# Patient Record
Sex: Male | Born: 1935 | Race: Black or African American | Hispanic: No | Smoking: Never smoker
Health system: Southern US, Community
[De-identification: ages and names within clinical notes are randomized; demographics above are authoritative.]

## PROBLEM LIST (undated history)

## (undated) DIAGNOSIS — I10 Essential (primary) hypertension: Secondary | ICD-10-CM

## (undated) DIAGNOSIS — M1990 Unspecified osteoarthritis, unspecified site: Secondary | ICD-10-CM

## (undated) DIAGNOSIS — I152 Hypertension secondary to endocrine disorders: Secondary | ICD-10-CM

## (undated) DIAGNOSIS — E1159 Type 2 diabetes mellitus with other circulatory complications: Secondary | ICD-10-CM

## (undated) DIAGNOSIS — G629 Polyneuropathy, unspecified: Secondary | ICD-10-CM

## (undated) DIAGNOSIS — E119 Type 2 diabetes mellitus without complications: Secondary | ICD-10-CM

## (undated) DIAGNOSIS — F0150 Vascular dementia without behavioral disturbance: Secondary | ICD-10-CM

## (undated) DIAGNOSIS — F0280 Dementia in other diseases classified elsewhere without behavioral disturbance: Secondary | ICD-10-CM

## (undated) HISTORY — DX: Essential (primary) hypertension: I10

---

## 2005-09-04 ENCOUNTER — Ambulatory Visit: Payer: Self-pay | Admitting: Ophthalmology

## 2005-09-10 ENCOUNTER — Ambulatory Visit: Payer: Self-pay | Admitting: Ophthalmology

## 2006-01-15 ENCOUNTER — Ambulatory Visit: Payer: Self-pay | Admitting: Psychiatry

## 2006-01-16 ENCOUNTER — Ambulatory Visit: Payer: Self-pay | Admitting: Psychiatry

## 2006-04-05 ENCOUNTER — Ambulatory Visit: Payer: Self-pay | Admitting: Gastroenterology

## 2006-05-03 ENCOUNTER — Inpatient Hospital Stay: Payer: Self-pay | Admitting: General Surgery

## 2006-07-26 ENCOUNTER — Inpatient Hospital Stay (HOSPITAL_COMMUNITY): Admission: AD | Admit: 2006-07-26 | Discharge: 2006-07-28 | Payer: Self-pay | Admitting: Neurosurgery

## 2007-05-13 ENCOUNTER — Ambulatory Visit: Payer: Self-pay | Admitting: Gastroenterology

## 2007-07-16 ENCOUNTER — Emergency Department: Payer: Self-pay | Admitting: Emergency Medicine

## 2007-12-22 ENCOUNTER — Ambulatory Visit: Payer: Self-pay | Admitting: Internal Medicine

## 2008-06-26 ENCOUNTER — Ambulatory Visit: Payer: Self-pay | Admitting: Gastroenterology

## 2008-07-03 ENCOUNTER — Ambulatory Visit: Payer: Self-pay | Admitting: Internal Medicine

## 2009-02-21 ENCOUNTER — Ambulatory Visit: Payer: Self-pay | Admitting: Internal Medicine

## 2009-03-29 ENCOUNTER — Ambulatory Visit: Payer: Self-pay | Admitting: Urology

## 2009-05-16 ENCOUNTER — Inpatient Hospital Stay: Payer: Self-pay | Admitting: Internal Medicine

## 2009-05-16 ENCOUNTER — Ambulatory Visit: Payer: Self-pay | Admitting: Gastroenterology

## 2014-01-26 ENCOUNTER — Emergency Department: Payer: Self-pay | Admitting: Emergency Medicine

## 2014-01-26 LAB — COMPREHENSIVE METABOLIC PANEL
ALBUMIN: 3.5 g/dL (ref 3.4–5.0)
ALK PHOS: 107 U/L
Anion Gap: 6 — ABNORMAL LOW (ref 7–16)
BILIRUBIN TOTAL: 0.4 mg/dL (ref 0.2–1.0)
BUN: 21 mg/dL — ABNORMAL HIGH (ref 7–18)
CHLORIDE: 107 mmol/L (ref 98–107)
CREATININE: 1.7 mg/dL — AB (ref 0.60–1.30)
Calcium, Total: 8.8 mg/dL (ref 8.5–10.1)
Co2: 28 mmol/L (ref 21–32)
EGFR (Non-African Amer.): 39 — ABNORMAL LOW
GFR CALC AF AMER: 45 — AB
GLUCOSE: 151 mg/dL — AB (ref 65–99)
OSMOLALITY: 287 (ref 275–301)
POTASSIUM: 3.4 mmol/L — AB (ref 3.5–5.1)
SGOT(AST): 26 U/L (ref 15–37)
SGPT (ALT): 19 U/L (ref 12–78)
Sodium: 141 mmol/L (ref 136–145)
Total Protein: 7.3 g/dL (ref 6.4–8.2)

## 2014-01-26 LAB — CBC WITH DIFFERENTIAL/PLATELET
BASOS PCT: 0.2 %
Basophil #: 0 10*3/uL (ref 0.0–0.1)
EOS ABS: 0 10*3/uL (ref 0.0–0.7)
Eosinophil %: 0.2 %
HCT: 37.9 % — ABNORMAL LOW (ref 40.0–52.0)
HGB: 12.1 g/dL — ABNORMAL LOW (ref 13.0–18.0)
LYMPHS PCT: 1.4 %
Lymphocyte #: 0.1 10*3/uL — ABNORMAL LOW (ref 1.0–3.6)
MCH: 23.6 pg — AB (ref 26.0–34.0)
MCHC: 31.9 g/dL — AB (ref 32.0–36.0)
MCV: 74 fL — AB (ref 80–100)
MONO ABS: 0.1 x10 3/mm — AB (ref 0.2–1.0)
MONOS PCT: 0.7 %
NEUTROS ABS: 8.5 10*3/uL — AB (ref 1.4–6.5)
NEUTROS PCT: 97.5 %
Platelet: 162 10*3/uL (ref 150–440)
RBC: 5.12 10*6/uL (ref 4.40–5.90)
RDW: 15.7 % — AB (ref 11.5–14.5)
WBC: 8.7 10*3/uL (ref 3.8–10.6)

## 2014-01-26 LAB — URINALYSIS, COMPLETE
Bilirubin,UR: NEGATIVE
Glucose,UR: 50 mg/dL (ref 0–75)
KETONE: NEGATIVE
NITRITE: NEGATIVE
Ph: 5 (ref 4.5–8.0)
RBC,UR: 1247 /HPF (ref 0–5)
SPECIFIC GRAVITY: 1.009 (ref 1.003–1.030)
Squamous Epithelial: NONE SEEN

## 2014-01-26 LAB — LIPASE, BLOOD: Lipase: 175 U/L (ref 73–393)

## 2014-01-27 LAB — URINE CULTURE

## 2016-09-30 ENCOUNTER — Emergency Department
Admission: EM | Admit: 2016-09-30 | Discharge: 2016-09-30 | Disposition: A | Discharge status: Home / Self Care | Payer: Self-pay | Attending: Emergency Medicine | Admitting: Emergency Medicine

## 2016-09-30 DIAGNOSIS — Z5321 Procedure and treatment not carried out due to patient leaving prior to being seen by health care provider: Secondary | ICD-10-CM | POA: Insufficient documentation

## 2016-09-30 DIAGNOSIS — R300 Dysuria: Secondary | ICD-10-CM | POA: Insufficient documentation

## 2016-09-30 NOTE — ED Notes (Signed)
No answer when called several times from lobby 

## 2016-10-01 ENCOUNTER — Inpatient Hospital Stay: Payer: Medicare PPO

## 2016-10-01 ENCOUNTER — Encounter: Payer: Self-pay | Admitting: Emergency Medicine

## 2016-10-01 ENCOUNTER — Inpatient Hospital Stay
Admission: EM | Admit: 2016-10-01 | Discharge: 2016-10-03 | DRG: 683 | Disposition: A | Discharge status: Home Health | Payer: Medicare PPO | Attending: Internal Medicine | Admitting: Internal Medicine

## 2016-10-01 DIAGNOSIS — M25512 Pain in left shoulder: Secondary | ICD-10-CM | POA: Diagnosis present

## 2016-10-01 DIAGNOSIS — M6281 Muscle weakness (generalized): Secondary | ICD-10-CM

## 2016-10-01 DIAGNOSIS — N320 Bladder-neck obstruction: Secondary | ICD-10-CM | POA: Diagnosis present

## 2016-10-01 DIAGNOSIS — N179 Acute kidney failure, unspecified: Secondary | ICD-10-CM | POA: Diagnosis present

## 2016-10-01 DIAGNOSIS — N401 Enlarged prostate with lower urinary tract symptoms: Secondary | ICD-10-CM | POA: Diagnosis present

## 2016-10-01 DIAGNOSIS — R262 Difficulty in walking, not elsewhere classified: Secondary | ICD-10-CM

## 2016-10-01 DIAGNOSIS — R531 Weakness: Secondary | ICD-10-CM | POA: Diagnosis present

## 2016-10-01 DIAGNOSIS — N189 Chronic kidney disease, unspecified: Secondary | ICD-10-CM

## 2016-10-01 DIAGNOSIS — W19XXXA Unspecified fall, initial encounter: Secondary | ICD-10-CM | POA: Diagnosis present

## 2016-10-01 DIAGNOSIS — R339 Retention of urine, unspecified: Secondary | ICD-10-CM | POA: Diagnosis present

## 2016-10-01 DIAGNOSIS — N181 Chronic kidney disease, stage 1: Secondary | ICD-10-CM | POA: Diagnosis present

## 2016-10-01 DIAGNOSIS — D696 Thrombocytopenia, unspecified: Secondary | ICD-10-CM | POA: Diagnosis present

## 2016-10-01 DIAGNOSIS — Z23 Encounter for immunization: Secondary | ICD-10-CM

## 2016-10-01 DIAGNOSIS — N390 Urinary tract infection, site not specified: Secondary | ICD-10-CM | POA: Diagnosis present

## 2016-10-01 DIAGNOSIS — Z7982 Long term (current) use of aspirin: Secondary | ICD-10-CM

## 2016-10-01 DIAGNOSIS — N289 Disorder of kidney and ureter, unspecified: Secondary | ICD-10-CM

## 2016-10-01 DIAGNOSIS — D72829 Elevated white blood cell count, unspecified: Secondary | ICD-10-CM

## 2016-10-01 DIAGNOSIS — N138 Other obstructive and reflux uropathy: Secondary | ICD-10-CM | POA: Diagnosis present

## 2016-10-01 DIAGNOSIS — R338 Other retention of urine: Secondary | ICD-10-CM | POA: Diagnosis present

## 2016-10-01 DIAGNOSIS — R197 Diarrhea, unspecified: Secondary | ICD-10-CM | POA: Diagnosis present

## 2016-10-01 DIAGNOSIS — R2681 Unsteadiness on feet: Secondary | ICD-10-CM

## 2016-10-01 LAB — COMPREHENSIVE METABOLIC PANEL
ALBUMIN: 3.4 g/dL — AB (ref 3.5–5.0)
ALK PHOS: 64 U/L (ref 38–126)
ALT: 38 U/L (ref 17–63)
AST: 108 U/L — AB (ref 15–41)
Anion gap: 13 (ref 5–15)
BILIRUBIN TOTAL: 0.7 mg/dL (ref 0.3–1.2)
BUN: 34 mg/dL — AB (ref 6–20)
CO2: 23 mmol/L (ref 22–32)
CREATININE: 3.02 mg/dL — AB (ref 0.61–1.24)
Calcium: 8.7 mg/dL — ABNORMAL LOW (ref 8.9–10.3)
Chloride: 101 mmol/L (ref 101–111)
GFR calc Af Amer: 21 mL/min — ABNORMAL LOW (ref >60)
GFR, EST NON AFRICAN AMERICAN: 18 mL/min — AB (ref >60)
GLUCOSE: 120 mg/dL — AB (ref 65–99)
Potassium: 3.6 mmol/L (ref 3.5–5.1)
Sodium: 137 mmol/L (ref 135–145)
TOTAL PROTEIN: 7 g/dL (ref 6.5–8.1)

## 2016-10-01 LAB — URINALYSIS, COMPLETE (UACMP) WITH MICROSCOPIC
Bilirubin Urine: NEGATIVE
GLUCOSE, UA: 50 mg/dL — AB
Ketones, ur: NEGATIVE mg/dL
Nitrite: NEGATIVE
PROTEIN: 100 mg/dL — AB
Specific Gravity, Urine: 1.015 (ref 1.005–1.030)
pH: 5 (ref 5.0–8.0)

## 2016-10-01 LAB — CBC WITH DIFFERENTIAL/PLATELET
BASOS ABS: 0 10*3/uL (ref 0–0.1)
Basophils Relative: 0 %
Eosinophils Absolute: 0 10*3/uL (ref 0–0.7)
Eosinophils Relative: 0 %
HEMATOCRIT: 40.4 % (ref 40.0–52.0)
HEMOGLOBIN: 12.9 g/dL — AB (ref 13.0–18.0)
LYMPHS PCT: 3 %
Lymphs Abs: 0.6 10*3/uL — ABNORMAL LOW (ref 1.0–3.6)
MCH: 23.2 pg — ABNORMAL LOW (ref 26.0–34.0)
MCHC: 31.9 g/dL — ABNORMAL LOW (ref 32.0–36.0)
MCV: 72.8 fL — AB (ref 80.0–100.0)
MONO ABS: 1.7 10*3/uL — AB (ref 0.2–1.0)
Monocytes Relative: 8 %
NEUTROS ABS: 18.7 10*3/uL — AB (ref 1.4–6.5)
NEUTROS PCT: 89 %
Platelets: 148 10*3/uL — ABNORMAL LOW (ref 150–440)
RBC: 5.54 MIL/uL (ref 4.40–5.90)
RDW: 15.7 % — ABNORMAL HIGH (ref 11.5–14.5)
WBC: 21.1 10*3/uL — AB (ref 3.8–10.6)

## 2016-10-01 MED ORDER — TROLAMINE SALICYLATE 10 % EX CREA
TOPICAL_CREAM | Freq: Two times a day (BID) | CUTANEOUS | Status: DC | PRN
  Administered 2016-10-01 – 2016-10-02 (×2): via TOPICAL
  Filled 2016-10-01 (×2): qty 85

## 2016-10-01 MED ORDER — ASPIRIN EC 81 MG PO TBEC
81.0000 mg | DELAYED_RELEASE_TABLET | Freq: Every day | ORAL | Status: DC
  Administered 2016-10-02 – 2016-10-03 (×2): 81 mg via ORAL
  Filled 2016-10-01 (×2): qty 1

## 2016-10-01 MED ORDER — ONDANSETRON HCL 4 MG PO TABS: 4.0000 mg | ORAL_TABLET | Freq: Four times a day (QID) | ORAL | Status: DC | PRN

## 2016-10-01 MED ORDER — CEFTRIAXONE SODIUM-DEXTROSE 1-3.74 GM-% IV SOLR
1.0000 g | Freq: Once | INTRAVENOUS | Status: AC
  Administered 2016-10-01: 1 g via INTRAVENOUS

## 2016-10-01 MED ORDER — DEXTROSE 5 % IV SOLN
1.0000 g | INTRAVENOUS | Status: DC
  Administered 2016-10-02: 1 g via INTRAVENOUS
  Filled 2016-10-01 (×2): qty 10

## 2016-10-01 MED ORDER — ACETAMINOPHEN 325 MG PO TABS
650.0000 mg | ORAL_TABLET | Freq: Four times a day (QID) | ORAL | Status: DC | PRN
  Administered 2016-10-02: 650 mg via ORAL
  Filled 2016-10-01: qty 2

## 2016-10-01 MED ORDER — PNEUMOCOCCAL VAC POLYVALENT 25 MCG/0.5ML IJ INJ
0.5000 mL | INJECTION | INTRAMUSCULAR | Status: AC
Start: 2016-10-02 — End: 2016-10-02
  Administered 2016-10-02: 0.5 mL via INTRAMUSCULAR
  Filled 2016-10-01: qty 0.5

## 2016-10-01 MED ORDER — FINASTERIDE 5 MG PO TABS
5.0000 mg | ORAL_TABLET | Freq: Every day | ORAL | Status: DC
  Administered 2016-10-01 – 2016-10-03 (×3): 5 mg via ORAL
  Filled 2016-10-01 (×5): qty 1

## 2016-10-01 MED ORDER — DEXTROSE 5 % IV SOLN: 1.0000 g | INTRAVENOUS | Status: DC

## 2016-10-01 MED ORDER — TRAMADOL HCL 50 MG PO TABS
50.0000 mg | ORAL_TABLET | Freq: Four times a day (QID) | ORAL | Status: DC | PRN
  Administered 2016-10-02: 50 mg via ORAL
  Filled 2016-10-01: qty 1

## 2016-10-01 MED ORDER — HEPARIN SODIUM (PORCINE) 5000 UNIT/ML IJ SOLN
5000.0000 [IU] | Freq: Three times a day (TID) | INTRAMUSCULAR | Status: DC
  Administered 2016-10-01 – 2016-10-03 (×5): 5000 [IU] via SUBCUTANEOUS
  Filled 2016-10-01 (×5): qty 1

## 2016-10-01 MED ORDER — CEFTRIAXONE SODIUM-DEXTROSE 1-3.74 GM-% IV SOLR
INTRAVENOUS | Status: AC
  Administered 2016-10-01: 1 g via INTRAVENOUS
  Filled 2016-10-01: qty 50

## 2016-10-01 MED ORDER — SODIUM CHLORIDE 0.9 % IV SOLN
INTRAVENOUS | Status: DC
  Administered 2016-10-01 – 2016-10-03 (×4): via INTRAVENOUS

## 2016-10-01 MED ORDER — SODIUM CHLORIDE 0.9 % IV BOLUS (SEPSIS)
1000.0000 mL | Freq: Once | INTRAVENOUS | Status: AC
  Administered 2016-10-01: 1000 mL via INTRAVENOUS

## 2016-10-01 MED ORDER — DEXTROSE 5 % IV SOLN: 1.0000 g | Freq: Once | INTRAVENOUS | Status: DC

## 2016-10-01 MED ORDER — ACETAMINOPHEN 650 MG RE SUPP: 650.0000 mg | Freq: Four times a day (QID) | RECTAL | Status: DC | PRN

## 2016-10-01 MED ORDER — DOCUSATE SODIUM 100 MG PO CAPS
100.0000 mg | ORAL_CAPSULE | Freq: Two times a day (BID) | ORAL | Status: DC
  Administered 2016-10-01 – 2016-10-02 (×2): 100 mg via ORAL
  Filled 2016-10-01 (×2): qty 1

## 2016-10-01 MED ORDER — INFLUENZA VAC SPLIT QUAD 0.5 ML IM SUSY
0.5000 mL | PREFILLED_SYRINGE | INTRAMUSCULAR | Status: AC
  Administered 2016-10-02: 0.5 mL via INTRAMUSCULAR
  Filled 2016-10-01 (×2): qty 0.5

## 2016-10-01 MED ORDER — TAMSULOSIN HCL 0.4 MG PO CAPS
0.4000 mg | ORAL_CAPSULE | Freq: Every day | ORAL | Status: DC
  Administered 2016-10-01 – 2016-10-03 (×3): 0.4 mg via ORAL
  Filled 2016-10-01 (×5): qty 1

## 2016-10-01 MED ORDER — ONDANSETRON HCL 4 MG/2ML IJ SOLN: 4.0000 mg | Freq: Four times a day (QID) | INTRAMUSCULAR | Status: DC | PRN

## 2016-10-01 NOTE — H&P (Signed)
[facility] [place] at [facility]   PATIENT NAME: Elijah Brightlyaul Delavega    MR#:  [id number]  DATE OF BIRTH:  January 15, 1936  DATE OF ADMISSION:  10/01/2016  PRIMARY CARE PHYSICIAN: Barbette ReichmannHANDE,VISHWANATH, MD   REQUESTING/REFERRING PHYSICIAN:   CHIEF COMPLAINT:   Chief Complaint  Patient presents with  . Urinary Retention    HISTORY OF PRESENT ILLNESS: Elijah Dominguez  is a 81 y.o. male with Not significant past medical history who presents to the hospital with complaints of lower abdominal pain. He presented to emergency room yesterday with bladder outlet obstruction. However, due to significant weight time he decided to go home. He came back today and was noted to have at 1 L of urine on bladder scan, for a catheter was placed and pussy urine was expelled. Patient's labs revealed elevated white cell count, thrombocytopenia, acute on chronic renal failure with creatinine of about 3. Hospitalist services were contacted for admission. Patient denies any fevers or chills, admits to weakness, admits of diarrhea for the couple of days now. He admits of fall yesterday in the evening, he was not able to get up for about 15 minutes. He hurt his left shoulder.  PAST MEDICAL HISTORY:  History reviewed. No pertinent past medical history.  PAST SURGICAL HISTORY: History reviewed. No pertinent surgical history.  SOCIAL HISTORY:  Social History  Substance Use Topics  . Smoking status: Never Smoker  . Smokeless tobacco: Never Used  . Alcohol use No    FAMILY HISTORY: No family history on file.  DRUG ALLERGIES: No Known Allergies  Review of Systems  Constitutional: Negative for chills, fever and weight loss.  HENT: Negative for congestion.   Eyes: Negative for blurred vision and double vision.  Respiratory: Negative for cough, sputum production, shortness of breath and wheezing.   Cardiovascular: Negative for chest pain, palpitations, orthopnea, leg swelling and PND.  Gastrointestinal:  Positive for abdominal pain and diarrhea. Negative for blood in stool, constipation, nausea and vomiting.  Genitourinary: Positive for dysuria. Negative for frequency, hematuria and urgency.  Musculoskeletal: Positive for falls and joint pain.  Neurological: Negative for dizziness, tremors, focal weakness and headaches.  Endo/Heme/Allergies: Does not bruise/bleed easily.  Psychiatric/Behavioral: Negative for depression. The patient does not have insomnia.     MEDICATIONS AT HOME:  Prior to Admission medications   Medication Sig Start Date End Date Taking? Authorizing Provider  aspirin EC 81 MG tablet Take 81 mg by mouth daily.    Yes Historical Provider, MD      PHYSICAL EXAMINATION:   VITAL SIGNS: Blood pressure 140/67, pulse 63, temperature 98 F (36.7 C), temperature source Oral, resp. rate 17, height 6\' 2"  (1.88 m), weight 86.2 kg (190 lb), SpO2 100 %.  GENERAL:  81 y.o.-year-old patient lying in the bed with no acute distress.  EYES: Pupils equal, round, reactive to light and accommodation. No scleral icterus. Extraocular muscles intact.  HEENT: Head atraumatic, normocephalic. Oropharynx and nasopharynx clear.  NECK:  Supple, no jugular venous distention. No thyroid enlargement, no tenderness.  LUNGS: Normal breath sounds bilaterally, no wheezing, rales,rhonchi or crepitation. No use of accessory muscles of respiration.  CARDIOVASCULAR: S1, S2 normal. No murmurs, rubs, or gallops.  ABDOMEN: Soft, Tender in the suprapubic area but no rebound or guarding, nondistended. Bowel sounds present. No organomegaly or mass. Foley catheter reveals urine with sediment, darker color EXTREMITIES: No pedal edema, cyanosis, or clubbing. Left shoulder range of motion is decreased, palpation did not exhibit any bony abnormalities NEUROLOGIC:  Cranial nerves II through XII are intact. Muscle strength 5/5 in all extremities. Sensation intact. Gait not checked.  PSYCHIATRIC: The patient is alert and  oriented x 3.  SKIN: No obvious rash, lesion, or ulcer.   LABORATORY PANEL:   CBC  Recent Labs Lab 10/01/16 1231  WBC 21.1*  HGB 12.9*  HCT 40.4  PLT 148*  MCV 72.8*  MCH 23.2*  MCHC 31.9*  RDW 15.7*  LYMPHSABS 0.6*  MONOABS 1.7*  EOSABS 0.0  BASOSABS 0.0   ------------------------------------------------------------------------------------------------------------------  Chemistries   Recent Labs Lab 10/01/16 1231  NA 137  K 3.6  CL 101  CO2 23  GLUCOSE 120*  BUN 34*  CREATININE 3.02*  CALCIUM 8.7*  AST 108*  ALT 38  ALKPHOS 64  BILITOT 0.7   ------------------------------------------------------------------------------------------------------------------  Cardiac Enzymes No results for input(s): TROPONINI in the last 168 hours. ------------------------------------------------------------------------------------------------------------------  RADIOLOGY: No results found.  EKG: No orders found for this or any previous visit.  IMPRESSION AND PLAN:  Active Problems:   Acute on chronic renal failure (HCC)   Urinary tract infection   Acute urinary retention   Generalized weakness   Leukocytosis  #1. Urinary tract infection with bladder outlet obstruction, acute urinary retention, status post Foley catheter placement, initiate patient and continue Rocephin, awaiting for urinary cultures #2. Bladder outlet obstruction/acute urinary retention, likely urinary tract infection/BPH related, continue Foley, initiate patient on Flomax and finasteride, patient will need to have his Foley catheter removed within 1 week in physician's office #3. Acute on chronic renal failure, continue IV fluids, follow creatinine in the morning, get renal ultrasound, nephrologist consult #4. Generalized weakness, get physical therapist involved for recommendations, #5. Leukocytosis, follow with therapy #6. Left shoulder pain, Aspercreme, heating pad  All the records are reviewed  and case discussed with ED provider. Management plans discussed with the patient, family and they are in agreement.  CODE STATUS: Code Status History    This patient does not have a recorded code status. Please follow your organizational policy for patients in this situation.       TOTAL TIME TAKING CARE OF THIS PATIENT: 50 minutes.    Katharina Caper M.D on 10/01/2016 at 3:38 PM  Between 7am to 6pm - Pager - [phone] After 6pm go to [email or web address] - password [email or web address]  [place]  [facility]  Office  [phone]  CC: Primary care physician; Barbette Reichmann, MD

## 2016-10-01 NOTE — ED Triage Notes (Signed)
Pt c/o urinary retention for 2-3 days. Was here yesterday but left due to the wait.

## 2016-10-01 NOTE — ED Provider Notes (Signed)
Time Seen: Approximately 1221  I have reviewed the triage notes  Chief Complaint: Urinary Retention   History of Present Illness: Elijah Dominguez is a 81 y.o. male who is been having some issues with urinary output with some increasing lower abdominal pain for the last couple of days. Patient was here yesterday but due to volume cannot be seen yesterday and left without being seen by a physician. Patient returns today and had a bladder scan performed in triage area which showed greater than a liter of urine. Patient had a Foley catheter established. He denies any fever, nausea, vomiting etc.   History reviewed. No pertinent past medical history.  There are no active problems to display for this patient.   History reviewed. No pertinent surgical history.  History reviewed. No pertinent surgical history.  Current Outpatient Rx  . Order #: [id number] Class: Historical Med    Allergies:  Patient has no known allergies.  Family History: No family history on file.  Social History: Social History  Substance Use Topics  . Smoking status: Never Smoker  . Smokeless tobacco: Never Used  . Alcohol use No     Review of Systems:   10 point review of systems was performed and was otherwise negative:  Constitutional: No fever Eyes: No visual disturbances ENT: No sore throat, ear pain Cardiac: No chest pain Respiratory: No shortness of breath, wheezing, or stridor Abdomen: Lower middle abdominal pain without flank pain. No vomiting or diarrhea. He describes some loose stool with some bowel urgency without constipation  Endocrine: No weight loss, No night sweats Extremities: No peripheral edema, cyanosis Skin: No rashes, easy bruising Neurologic: No focal weakness, trouble with speech or swollowing Urologic: No dysuria, Hematuria, or urinary frequency   Physical Exam:  ED Triage Vitals  Enc Vitals Group     BP 10/01/16 0952 (!) 155/67     Pulse Rate 10/01/16 0952 80     Resp  10/01/16 0952 18     Temp 10/01/16 0952 98 F (36.7 C)     Temp Source 10/01/16 0952 Oral     SpO2 10/01/16 0952 100 %     Weight 10/01/16 0953 190 lb (86.2 kg)     Height 10/01/16 0953 6\' 2"  (1.88 m)     Head Circumference --      Peak Flow --      Pain Score 10/01/16 0953 10     Pain Loc --      Pain Edu? --      Excl. in GC? --     General: Awake , Alert , and Oriented times 3; GCS 15 Head: Normal cephalic , atraumatic Eyes: Pupils equal , round, reactive to light Nose/Throat: No nasal drainage, patent upper airway without erythema or exudate. Dry mucous membranes Neck: Supple, Full range of motion, No anterior adenopathy or palpable thyroid masses Lungs: Clear to ascultation without wheezes , rhonchi, or rales Heart: Regular rate, regular rhythm without murmurs , gallops , or rubs Abdomen: Soft, non tender without rebound, guarding , or rigidity; bowel sounds positive and symmetric in all 4 quadrants. No organomegaly .        Extremities: 2 plus symmetric pulses. No edema, clubbing or cyanosis Neurologic: normal ambulation, Motor symmetric without deficits, sensory intact Skin: warm, dry, no rashes   Labs:   All laboratory work was reviewed including any pertinent negatives or positives listed below:  Labs Reviewed  URINALYSIS, COMPLETE (UACMP) WITH MICROSCOPIC - Abnormal; Notable for the following:  Result Value   Color, Urine YELLOW (*)    APPearance TURBID (*)    Glucose, UA 50 (*)    Hgb urine dipstick LARGE (*)    Protein, ur 100 (*)    Leukocytes, UA SMALL (*)    Bacteria, UA MANY (*)    Squamous Epithelial / LPF 0-5 (*)    All other components within normal limits  CBC WITH DIFFERENTIAL/PLATELET - Abnormal; Notable for the following:    WBC 21.1 (*)    Hemoglobin 12.9 (*)    MCV 72.8 (*)    MCH 23.2 (*)    MCHC 31.9 (*)    RDW 15.7 (*)    Platelets 148 (*)    Neutro Abs 18.7 (*)    Lymphs Abs 0.6 (*)    Monocytes Absolute 1.7 (*)    All other  components within normal limits  COMPREHENSIVE METABOLIC PANEL - Abnormal; Notable for the following:    Glucose, Bld 120 (*)    BUN 34 (*)    Creatinine, Ser 3.02 (*)    Calcium 8.7 (*)    Albumin 3.4 (*)    AST 108 (*)    GFR calc non Af Amer 18 (*)    GFR calc Af Amer 21 (*)    All other components within normal limits  URINE CULTURE   Review of laboratory work shows an elevated white blood cell count and also some prerenal insufficiency with an increasing creatinine to 3.02 Urine culture is pending    ED Course: * The patient had a Foley catheter established in the triage area and had greater than 1 L of urine output. Patient's otherwise hemodynamically stable and doesn't appear to have any symptoms consistent with sepsis. Patient was started on a 1 L normal saline bolus and IV Rocephin with urine culture pending. Clinical Course      Assessment: Urinary tract infection versus prostatitis Urinary outlet obstruction Renal failure  Final Clinical Impression  Final diagnoses:  Acute urinary retention  Renal insufficiency     Plan:  Inpatient management            Jennye Moccasin, MD 10/01/16 1429

## 2016-10-02 LAB — CBC
HEMATOCRIT: 37.2 % — AB (ref 40.0–52.0)
HEMOGLOBIN: 12.1 g/dL — AB (ref 13.0–18.0)
MCH: 23.4 pg — AB (ref 26.0–34.0)
MCHC: 32.6 g/dL (ref 32.0–36.0)
MCV: 71.8 fL — ABNORMAL LOW (ref 80.0–100.0)
Platelets: 142 10*3/uL — ABNORMAL LOW (ref 150–440)
RBC: 5.18 MIL/uL (ref 4.40–5.90)
RDW: 15.4 % — ABNORMAL HIGH (ref 11.5–14.5)
WBC: 15.5 10*3/uL — ABNORMAL HIGH (ref 3.8–10.6)

## 2016-10-02 LAB — BASIC METABOLIC PANEL
ANION GAP: 5 (ref 5–15)
BUN: 27 mg/dL — AB (ref 6–20)
CHLORIDE: 107 mmol/L (ref 101–111)
CO2: 28 mmol/L (ref 22–32)
Calcium: 8.4 mg/dL — ABNORMAL LOW (ref 8.9–10.3)
Creatinine, Ser: 1.54 mg/dL — ABNORMAL HIGH (ref 0.61–1.24)
GFR calc Af Amer: 48 mL/min — ABNORMAL LOW (ref >60)
GFR, EST NON AFRICAN AMERICAN: 41 mL/min — AB (ref >60)
GLUCOSE: 134 mg/dL — AB (ref 65–99)
POTASSIUM: 3.5 mmol/L (ref 3.5–5.1)
Sodium: 140 mmol/L (ref 135–145)

## 2016-10-02 NOTE — Progress Notes (Signed)
[facility] - [place] at [facility]   PATIENT NAME: Elijah Dominguez    MR#:  [id number]  DATE OF BIRTH:  10/30/1935  SUBJECTIVE: Patient is seen at bedside, admitted yesterday because of urine   retention, renal failure. Also has UTI, started on antibiotics, IV fluids. Today renal function improved. Patient says that he feels little better. Foley is draining clear yellow urine.   CHIEF COMPLAINT:   Chief Complaint  Patient presents with  . Urinary Retention    REVIEW OF SYSTEMS:   ROS CONSTITUTIONAL: No fever, fatigue or weakness.  EYES: No blurred or double vision.  EARS, NOSE, AND THROAT: No tinnitus or ear pain.  RESPIRATORY: No cough, shortness of breath, wheezing or hemoptysis.  CARDIOVASCULAR: No chest pain, orthopnea, edema.  GASTROINTESTINAL: No nausea, vomiting, diarrhea or abdominal pain.  GENITOURINARY: No dysuria, hematuria.  ENDOCRINE: No polyuria, nocturia,  HEMATOLOGY: No anemia, easy bruising or bleeding SKIN: No rash or lesion. MUSCULOSKELETAL: No joint pain or arthritis.   NEUROLOGIC: No tingling, numbness, weakness.  PSYCHIATRY: No anxiety or depression.   DRUG ALLERGIES:  No Known Allergies  VITALS:  Blood pressure (!) 157/61, pulse 68, temperature 97.8 F (36.6 C), temperature source Oral, resp. rate 16, height 6\' 2"  (1.88 m), weight 86.2 kg (190 lb), SpO2 100 %.  PHYSICAL EXAMINATION:  GENERAL:  81 y.o.-year-old patient lying in the bed with no acute distress.  EYES: Pupils equal, round, reactive to light and accommodation. No scleral icterus. Extraocular muscles intact.  HEENT: Head atraumatic, normocephalic. Oropharynx and nasopharynx clear.  NECK:  Supple, no jugular venous distention. No thyroid enlargement, no tenderness.  LUNGS: Normal breath sounds bilaterally, no wheezing, rales,rhonchi or crepitation. No use of accessory muscles of respiration.  CARDIOVASCULAR: S1, S2 normal. No murmurs, rubs, or gallops.  ABDOMEN: Soft,  nontender, nondistended. Bowel sounds present. No organomegaly or mass.  EXTREMITIES: No pedal edema, cyanosis, or clubbing.  NEUROLOGIC: Cranial nerves II through XII are intact. Muscle strength 5/5 in all extremities. Sensation intact. Gait not checked.  PSYCHIATRIC: The patient is alert and oriented x 3.  SKIN: No obvious rash, lesion, or ulcer.    LABORATORY PANEL:   CBC  Recent Labs Lab 10/02/16 0518  WBC 15.5*  HGB 12.1*  HCT 37.2*  PLT 142*   ------------------------------------------------------------------------------------------------------------------  Chemistries   Recent Labs Lab 10/01/16 1231 10/02/16 0518  NA 137 140  K 3.6 3.5  CL 101 107  CO2 23 28  GLUCOSE 120* 134*  BUN 34* 27*  CREATININE 3.02* 1.54*  CALCIUM 8.7* 8.4*  AST 108*  --   ALT 38  --   ALKPHOS 64  --   BILITOT 0.7  --    ------------------------------------------------------------------------------------------------------------------  Cardiac Enzymes No results for input(s): TROPONINI in the last 168 hours. ------------------------------------------------------------------------------------------------------------------  RADIOLOGY:  US Renal  Result Date: 10/01/2016 CLINICAL DATA:  Acute renal failure. EXAM: RENAL / URINARY TRACT ULTRASOUND COMPLETE COMPARISON:  01/26/2014. FINDINGS: Right Kidney: Length: 10.4 cm. Echogenicity within normal limits. No mass or hydronephrosis visualized. Left Kidney: Length: 10.5 cm. Echogenicity within normal limits. No hydronephrosis visualized. 4.9 cm simple cyst midportion left kidney. Bladder: Foley is present in the bladder. Bladder is nondistended. Bladder wall is thickened. This could be the from lack of distention however bladder pathology including cystitis cannot be excluded. IMPRESSION: 1. Renal echotexture normal. No hydronephrosis. Simple cysts left kidney. 2. Foley catheter noted bladder. The bladder is nondistended. Bladder wall is  thickened. This could be from nondistended  state. Active bladder pathology including cystitis cannot be excluded. Electronically Signed   By: Maisie Fushomas  Register   On: 10/01/2016 16:41    EKG:  No orders found for this or any previous visit.  ASSESSMENT AND PLAN:   Abdominal pain secondary to urine retention: Patient had 1 L of urine in bladder in the emergency room, after Foley is inserted patient felt better. Started on first BPH medications, Foley. And his abdominal pain improved. #2 acute renal failure: Postobstructive: Improved with the Foley, Flomax, IV fluids also. #3 U tI: Continue Rocephin, follow urine cultures, CBC improving. From 21-15.5. Diarrhea: Improved. Code full code.    All the records are reviewed and case discussed with Care Management/Social Workerr. Management plans discussed with the patient, family and they are in agreement.  CODE STATUS: full  TOTAL TIME TAKING CARE OF THIS PATIENT: 35 minutes.   POSSIBLE D/C IN 1-2  DAYS, DEPENDING ON CLINICAL CONDITION.   Katha HammingKONIDENA,Makeisha Jentsch M.D on 10/02/2016 at 2:02 PM  Between 7am to 6pm - Pager - [phone]  After 6pm go to [email or web address] - password [email or web address]  [facility]  Office  [phone]  CC: Primary care physician; Barbette ReichmannHANDE,VISHWANATH, MD   Note: This dictation was prepared with Dragon dictation along with smaller phrase technology. Any transcriptional errors that result from this process are unintentional.

## 2016-10-02 NOTE — Consult Note (Signed)
[facility] CONSULT NOTE    Date: 10/02/2016                  Patient Name:  Elijah Dominguez  MRN: [id number]  DOB: 04-10-1936  Age / Sex: 81 y.o., male         PCP: Barbette Reichmann, MD                 Service Requesting Consult: Hospitalist                 Reason for Consult: Acute renal failure            History of Present Illness: Patient is a 81 y.o. male with No significant past medical history who was admitted to [facility] on 10/01/2016 for evaluation of Urinary retention.  He recently presented to the emergency department with difficulty urinating.  However secondary to prolonged wait time he left emergency department.  When he came back he was noted as having 1 L of urine on bladder scan. Subsequently a Foley catheter was placed and he was found to have purulent urine.  He has been started on finasteride and Flomax.  Renal ultrasound was actually negative for hydronephrosis.  In addition he has been started on ceftriaxone.   Medications: Outpatient medications: Prescriptions Prior to Admission  Medication Sig Dispense Refill Last Dose  . aspirin EC 81 MG tablet Take 81 mg by mouth daily.    Past Week at 0900    Current medications: Current Facility-Administered Medications  Medication Dose Route Frequency Provider Last Rate Last Dose  . 0.9 %  sodium chloride infusion   Intravenous Continuous Katha Hamming, MD 75 mL/hr at 10/02/16 1417    . acetaminophen (TYLENOL) tablet 650 mg  650 mg Oral Q6H PRN Katharina Caper, MD       Or  . acetaminophen (TYLENOL) suppository 650 mg  650 mg Rectal Q6H PRN Katharina Caper, MD      . aspirin EC tablet 81 mg  81 mg Oral Daily Katharina Caper, MD   81 mg at 10/02/16 1030  . cefTRIAXone (ROCEPHIN) 1 g in dextrose 5 % 50 mL IVPB  1 g Intravenous Q24H Katharina Caper, MD   1 g at 10/02/16 1413  . finasteride (PROSCAR) tablet 5 mg  5 mg Oral Daily Katharina Caper, MD   5 mg at 10/02/16 1030  . heparin injection 5,000 Units   5,000 Units Subcutaneous Q8H Katharina Caper, MD   5,000 Units at 10/02/16 1416  . ondansetron (ZOFRAN) tablet 4 mg  4 mg Oral Q6H PRN Katharina Caper, MD       Or  . ondansetron (ZOFRAN) injection 4 mg  4 mg Intravenous Q6H PRN Katharina Caper, MD      . tamsulosin (FLOMAX) capsule 0.4 mg  0.4 mg Oral Daily Katharina Caper, MD   0.4 mg at 10/02/16 1030  . traMADol (ULTRAM) tablet 50 mg  50 mg Oral Q6H PRN Katharina Caper, MD   50 mg at 10/02/16 1236  . trolamine salicylate (ASPERCREME) 10 % cream   Topical BID PRN Katharina Caper, MD          Allergies: No Known Allergies    Past Medical History: History reviewed. No pertinent past medical history.   Past Surgical History: History reviewed. No pertinent surgical history.   Family History: No family history of ESRD  Social History: Social History   Social History  . Marital status: Widowed    Spouse name:  N/A  . Number of children: N/A  . Years of education: N/A   Occupational History  . Not on file.   Social History Main Topics  . Smoking status: Never Smoker  . Smokeless tobacco: Never Used  . Alcohol use No  . Drug use: Unknown  . Sexual activity: Not on file   Other Topics Concern  . Not on file   Social History Narrative  . No narrative on file     Review of Systems: Review of Systems  Constitutional: Positive for malaise/fatigue. Negative for chills, fever and weight loss.  HENT: Negative for hearing loss and tinnitus.   Eyes: Negative for blurred vision, double vision and photophobia.  Respiratory: Negative for cough, hemoptysis, sputum production and shortness of breath.   Cardiovascular: Negative for chest pain and palpitations.  Gastrointestinal: Positive for diarrhea. Negative for heartburn, nausea and vomiting.  Genitourinary: Positive for dysuria and frequency.  Musculoskeletal: Negative for back pain and myalgias.  Skin: Negative for itching and rash.  Neurological: Negative for dizziness and focal  weakness.  Endo/Heme/Allergies: Negative for polydipsia. Does not bruise/bleed easily.  Psychiatric/Behavioral: Negative for depression and memory loss. The patient is not nervous/anxious.      Vital Signs: Blood pressure (!) 157/61, pulse 68, temperature 97.8 F (36.6 C), temperature source Oral, resp. rate 16, height 6\' 2"  (1.88 m), weight 86.2 kg (190 lb), SpO2 100 %.  Weight trends: Filed Weights   10/01/16 0953  Weight: 86.2 kg (190 lb)    Physical Exam: General: NAD  Head: Normocephalic, atraumatic.  Eyes: Anicteric, EOMI  Nose: Mucous membranes moist, not inflammed, nonerythematous.  Throat: Oropharynx nonerythematous, no exudate appreciated.   Neck: Supple, trachea midline.  Lungs:  Normal respiratory effort. Clear to auscultation BL without crackles or wheezes.  Heart: RRR. S1 and S2 normal without gallop, murmur, or rubs.  Abdomen:  BS normoactive. Soft, Nondistended, non-tender.  No masses or organomegaly.  Extremities: No pretibial edema.  Neurologic: A&O X3, Motor strength is 5/5 in the all 4 extremities  Skin: No visible rashes, scars.    Lab results: Basic Metabolic Panel:  Recent Labs Lab 10/01/16 1231 10/02/16 0518  NA 137 140  K 3.6 3.5  CL 101 107  CO2 23 28  GLUCOSE 120* 134*  BUN 34* 27*  CREATININE 3.02* 1.54*  CALCIUM 8.7* 8.4*    Liver Function Tests:  Recent Labs Lab 10/01/16 1231  AST 108*  ALT 38  ALKPHOS 64  BILITOT 0.7  PROT 7.0  ALBUMIN 3.4*   No results for input(s): LIPASE, AMYLASE in the last 168 hours. No results for input(s): AMMONIA in the last 168 hours.  CBC:  Recent Labs Lab 10/01/16 1231 10/02/16 0518  WBC 21.1* 15.5*  NEUTROABS 18.7*  --   HGB 12.9* 12.1*  HCT 40.4 37.2*  MCV 72.8* 71.8*  PLT 148* 142*    Cardiac Enzymes: No results for input(s): CKTOTAL, CKMB, CKMBINDEX, TROPONINI in the last 168 hours.  BNP: Invalid input(s): POCBNP  CBG: No results for input(s): GLUCAP in the last 168  hours.  Microbiology: Results for orders placed or performed in visit on 01/26/14  Urine culture     Status: None   Collection Time: 01/26/14  1:37 AM  Result Value Ref Range Status   Micro Text Report   Final       SOURCE: CLEAN CATCH    ORGANISM 1                >  100,000 CFU/ML STREPTOCOCCUS AGALACTIAE(GROUP B)   COMMENT                   -   ANTIBIOTIC                                                        Coagulation Studies: No results for input(s): LABPROT, INR in the last 72 hours.  Urinalysis:  Recent Labs  10/01/16 1231  COLORURINE YELLOW*  LABSPEC 1.015  PHURINE 5.0  GLUCOSEU 50*  HGBUR LARGE*  BILIRUBINUR NEGATIVE  KETONESUR NEGATIVE  PROTEINUR 100*  NITRITE NEGATIVE  LEUKOCYTESUR SMALL*      Imaging: [place] Renal  Result Date: 10/01/2016 CLINICAL DATA:  Acute renal failure. EXAM: RENAL / URINARY TRACT ULTRASOUND COMPLETE COMPARISON:  01/26/2014. FINDINGS: Right Kidney: Length: 10.4 cm. Echogenicity within normal limits. No mass or hydronephrosis visualized. Left Kidney: Length: 10.5 cm. Echogenicity within normal limits. No hydronephrosis visualized. 4.9 cm simple cyst midportion left kidney. Bladder: Foley is present in the bladder. Bladder is nondistended. Bladder wall is thickened. This could be the from lack of distention however bladder pathology including cystitis cannot be excluded. IMPRESSION: 1. Renal echotexture normal. No hydronephrosis. Simple cysts left kidney. 2. Foley catheter noted bladder. The bladder is nondistended. Bladder wall is thickened. This could be from nondistended state. Active bladder pathology including cystitis cannot be excluded. Electronically Signed   By: Maisie Fushomas  Register   On: 10/01/2016 16:41      Assessment & Plan: Pt is a 81 y.o. male with No significant past medical history who [facility] for evaluation of Urinary retention.  1.  Acute renal failure likely secondary to acute urinary  retention. 2.  Urinary retention. 3.  Urinary tract infection due to #2.  Plan:  The patient presented with acute urinary retention which has now led to acute renal failure as well as urinary tract infection.  Agree with Foley catheter placement.  Continue IV fluid hydration with 0.9 normal saline at 75 cc per hour.  We also agree with ceftriaxone 1 g IV every 24 hours.  Patient should be continued on finasteride as well as Flomax as well.  Continue to follow renal function trend daily and avoid nephrotoxins as possible.  Followup BMP tomorrow as well.  Thanks for consultation.

## 2016-10-02 NOTE — Care Management Important Message (Signed)
Important Message  Patient Details  Name: Elijah Dominguez MRN: [id number] Date of Birth: 12-15-1935   Medicare Important Message Given:  Yes    Chapman FitchBOWEN, Shamiya Demeritt T, RN 10/02/2016, 4:41 PM

## 2016-10-02 NOTE — Clinical Social Work Note (Signed)
Consult placed by nursing for potential rehab needs, consult placed by nursing to RN CM for potential home health needs. PT will need to assess patient and make recommendations. Please re-consult CSW if situation warrants. York SpanielMonica Marveline Profeta MSW,LCSW [phone]

## 2016-10-02 NOTE — Care Management (Signed)
Patient admitted from home with AKI, urinary retention, UTI.  Patient lives at home with daughter.  Will likely discharge with foley.  PCP HANDE.  PT consult pending.  RNCM following

## 2016-10-03 LAB — BASIC METABOLIC PANEL
Anion gap: 8 (ref 5–15)
BUN: 18 mg/dL (ref 6–20)
CO2: 25 mmol/L (ref 22–32)
CREATININE: 1.12 mg/dL (ref 0.61–1.24)
Calcium: 8.5 mg/dL — ABNORMAL LOW (ref 8.9–10.3)
Chloride: 107 mmol/L (ref 101–111)
GFR calc Af Amer: >60 mL/min (ref >60)
GFR calc non Af Amer: >60 mL/min (ref >60)
Glucose, Bld: 119 mg/dL — ABNORMAL HIGH (ref 65–99)
POTASSIUM: 3.4 mmol/L — AB (ref 3.5–5.1)
Sodium: 140 mmol/L (ref 135–145)

## 2016-10-03 LAB — CBC
HEMATOCRIT: 33.9 % — AB (ref 40.0–52.0)
HEMOGLOBIN: 11 g/dL — AB (ref 13.0–18.0)
MCH: 23.6 pg — ABNORMAL LOW (ref 26.0–34.0)
MCHC: 32.5 g/dL (ref 32.0–36.0)
MCV: 72.4 fL — AB (ref 80.0–100.0)
Platelets: 143 10*3/uL — ABNORMAL LOW (ref 150–440)
RBC: 4.68 MIL/uL (ref 4.40–5.90)
RDW: 15.3 % — ABNORMAL HIGH (ref 11.5–14.5)
WBC: 9.7 10*3/uL (ref 3.8–10.6)

## 2016-10-03 LAB — URINE CULTURE: Culture: NO GROWTH

## 2016-10-03 MED ORDER — AMLODIPINE BESYLATE 5 MG PO TABS: 5.0000 mg | ORAL_TABLET | Freq: Every day | ORAL | 0 refills | Status: DC

## 2016-10-03 MED ORDER — TROLAMINE SALICYLATE 10 % EX CREA: TOPICAL_CREAM | Freq: Two times a day (BID) | CUTANEOUS | 0 refills | Status: DC | PRN

## 2016-10-03 MED ORDER — TAMSULOSIN HCL 0.4 MG PO CAPS: 0.4000 mg | ORAL_CAPSULE | Freq: Every day | ORAL | 0 refills | Status: DC

## 2016-10-03 MED ORDER — CEPHALEXIN 500 MG PO CAPS: 500.0000 mg | ORAL_CAPSULE | Freq: Two times a day (BID) | ORAL | 0 refills | Status: DC

## 2016-10-03 MED ORDER — FINASTERIDE 5 MG PO TABS: 5.0000 mg | ORAL_TABLET | Freq: Every day | ORAL | 0 refills | Status: DC

## 2016-10-03 NOTE — Care Management Note (Signed)
Case Management Note  Patient Details  Name: Elijah Dominguez MRN: [id number] Date of Birth: Sep 09, 1936  Subjective/Objective:       Discussed discharge planning with daughter [name] who chose [facility] to be Elijah Dominguez's home health services provider. A referral was faxed to [facility] requesting home health PT. Elijah Dominguez already has a RW at home per his daughters report.              Action/Plan:   Expected Discharge Date:  10/03/16               Expected Discharge Plan:     In-House Referral:     Discharge planning Services     Post Acute Care Choice:    Choice offered to:     DME Arranged:    DME Agency:     HH Arranged:    HH Agency:     Status of Service:     If discussed at [employer] Meetings, dates discussed:    Additional Comments:  Nylani Michetti A, RN 10/03/2016, 12:30 PM

## 2016-10-03 NOTE — Discharge Instructions (Signed)
Follow all MD discharge instructions. Take all medications as prescribed. Keep all follow up appointments. If your symptoms return, call your doctor. If you experience any new symptoms that are of concern to you or that are bothersome to you, call your doctor. For all questions and/or concerns, call your doctor.   If you have a medical emergency, call 911  You are being discharged with a urinary catheter (Foley catheter) in place. Do not attempt to remove it. Make sure to keep you penis and the area around your urethra clean. Do not touch the catheter tubing near your urethra. Empty your catheter daily, or when it becomes half-full, and write down how much urine you emptied, and bring that record to your follow up doctor appointment. Notify your doctor if urine stops coming out of you catheter, if you have bright red blood come out of your catheter, if you have bloating or abdominal fullness and/or pressure, or if you have pain in your pelvic area, stomach, or penis. For all other questions, call your doctor.     Acute Urinary Retention, Male Acute urinary retention is when you are unable to pee (urinate). Acute urinary retention is common in older men. Prostates can get bigger, which blocks the flow of pee. Follow these instructions at home:  Drink enough fluids to keep your pee clear or pale yellow.  If you are sent home with a tube that drains the bladder (catheter), there will be a drainage bag attached to it. There are two types of bags. One is big that you can wear at night without having to empty it. One is smaller and needs to be emptied more often.  Keep the drainage bag empty.  Keep the drainage bag lower than your catheter.  Only take medicine as told by your doctor. Contact a doctor if:  You have a low-grade fever.  You have spasms or you are leaking pee when you have spasms. Get help right away if:  You have chills or a fever.  Your catheter stops draining pee.  Your  catheter falls out.  You have increased bleeding that does not stop after you have rested and increased the amount of fluids you had been drinking. This information is not intended to replace advice given to you by your health care provider. Make sure you discuss any questions you have with your health care provider. Document Released: 02/24/2008 Document Revised: 02/13/2016 Document Reviewed: 02/16/2013 Elsevier Interactive Patient Education  2017 [employer]

## 2016-10-03 NOTE — Evaluation (Signed)
Physical Therapy Evaluation Patient Details Name: Elijah Dominguez MRN: [id number] DOB: Jan 02, 1936 Today's Date: 10/03/2016   History of Present Illness  Pt admitted for acute on chronic renal failure with UTI and bladder outlet obstruction. Pt complains of urinary retention and lower abdominal pain. Pt with recent fall and reports L shoulder pain. No pain reported this date.  Clinical Impression  Pt is a pleasant 81 year old male who was admitted for acute on chronic renal failure with UTI and bladder outlet obstruction. Pt performs bed mobility with mod I, transfers with supervision, and ambulation with cga and RW.  Further ambulation performed with SPC (pt's baseline) with slight unsteadiness noted. Recommend continued use of RW at this time with HHPT to [facility] Pt demonstrates deficits with balance/strength/mobility. Pt confused with situation upon arrival, however agreeable to therapy. Would benefit from skilled PT to address above deficits and promote optimal return to PLOF. Recommend transition to HHPT upon discharge from acute hospitalization.      Follow Up Recommendations Home health PT (with daughter to provide 24/7 assist until confusion clears)    Equipment Recommendations  Rolling walker with 5" wheels    Recommendations for Other Services       Precautions / Restrictions Precautions Precautions: Fall Restrictions Weight Bearing Restrictions: No      Mobility  Bed Mobility Overal bed mobility: Modified Independent             General bed mobility comments: safe technique without using rails for assist. Once seated at EOB, able to sit with upright posture/balance  Transfers Overall transfer level: Needs assistance Equipment used: Rolling walker (2 wheeled) Transfers: Sit to/from Stand Sit to Stand: Supervision         General transfer comment: safe technique with upright posture. Needs cues to push from seated surface instead of pulling  up on RW  Ambulation/Gait Ambulation/Gait assistance: Min guard Ambulation Distance (Feet): 60 Feet Assistive device: Rolling walker (2 wheeled) Gait Pattern/deviations: Step-through pattern     General Gait Details: slight unsteadiness using RW. Needs cues for sequencing and obstacle avoidance. Reciprocal gait pattern performed. Further ambulation in ther-ex [facility]  Stairs            Wheelchair Mobility    Modified Rankin (Stroke Patients Only)       Balance Overall balance assessment: History of Falls;Needs assistance Sitting-balance support: Feet supported Sitting balance-Leahy Scale: Good     Standing balance support: Bilateral upper extremity supported Standing balance-Leahy Scale: Good                               Pertinent Vitals/Pain Pain Assessment: No/denies pain    Home Living Family/patient expects to be discharged to:: Private residence Living Arrangements: Children (daughter) Available Help at Discharge: Family;Available PRN/intermittently Type of Home: House Home Access: Stairs to enter Entrance Stairs-Rails: None Entrance Stairs-Number of Steps: 3 Home Layout: Able to live on main level with bedroom/bathroom;Two level Home Equipment: Cane - single point      Prior Function Level of Independence: Independent with assistive device(s)         Comments: uses SPC     Hand Dominance        Extremity/Trunk Assessment   Upper Extremity Assessment Upper Extremity Assessment: Overall WFL for tasks assessed    Lower Extremity Assessment Lower Extremity Assessment: Generalized weakness (B LE grossly 4/5)       Communication  Communication: No difficulties  Cognition Arousal/Alertness: Awake/alert Behavior During Therapy: WFL for tasks assessed/performed Overall Cognitive Status: Impaired/Different from baseline                      General Comments      Exercises Other Exercises Other Exercises: Pt  ambulated using SPC with slight unsteadiness with min assist for prevention of falls. Pt drags SPC too far behind, needs cues for correct technique. 60' ambulated with slow gait speed   Assessment/Plan    PT Assessment Patient needs continued PT services  PT Problem List Decreased strength;Decreased activity tolerance;Decreased balance;Decreased mobility;Decreased safety awareness;Decreased cognition          PT Treatment Interventions Gait training;DME instruction;Therapeutic exercise    PT Goals (Current goals can be found in the Care Plan section)  Acute Rehab PT Goals Patient Stated Goal: to go home PT Goal Formulation: With patient Time For Goal Achievement: 10/17/16 Potential to Achieve Goals: Good    Frequency Min 2X/week   Barriers to discharge        Co-evaluation               End of Session Equipment Utilized During Treatment: Gait belt Activity Tolerance: Patient tolerated treatment well Patient left: in bed;with bed alarm set Nurse Communication: Mobility status         Time: 3086-5784 PT Time Calculation (min) (ACUTE ONLY): 20 min   Charges:   PT Evaluation $PT Eval Moderate Complexity: 1 Procedure PT Treatments $Gait Training: 8-22 mins   PT G Codes:        Satsuki Zillmer October 22, 2016, 3:38 PM  Elizabeth Palau, PT, DPT [phone]

## 2016-10-03 NOTE — Progress Notes (Signed)
[facility]  ROUNDING NOTE   Subjective:  Urine output was acceptable yesterday 1.3 L. Renal function has improved significantly and creatinine is down to 1.1. Foley catheter remains in place.   Objective:  Vital signs in last 24 hours:  Temp:  [97.8 F (36.6 C)-98.2 F (36.8 C)] 97.8 F (36.6 C) (01/13 0832) Pulse Rate:  [56-69] 69 (01/13 0832) Resp:  [16-18] 18 (01/13 0832) BP: (157-177)/(61-68) 177/68 (01/13 0832) SpO2:  [98 %-100 %] 100 % (01/13 0832)  Weight change:  Filed Weights   10/01/16 0953  Weight: 86.2 kg (190 lb)    Intake/Output: I/O last 3 completed shifts: In: 2319 [I.V.:2269; IV Piggyback:50] Out: 2825 [Urine:2825]   Intake/Output this shift:  Total I/O In: 411 [P.O.:240; I.V.:171] Out: 575 [Urine:575]  Physical Exam: General: No acute distress  Head: Normocephalic, atraumatic. Moist oral mucosal membranes  Eyes: Anicteric  Neck: Supple, trachea midline  Lungs:  Clear to auscultation, normal effort  Heart: S1S2 no rubs  Abdomen:  Soft, nontender,   Extremities:  peripheral edema.  Neurologic: Nonfocal, moving all four extremities  Skin: No lesions  GU: Foley in place    Basic Metabolic Panel:  Recent Labs Lab 10/01/16 1231 10/02/16 0518 10/03/16 0636  NA 137 140 140  K 3.6 3.5 3.4*  CL 101 107 107  CO2 23 28 25   GLUCOSE 120* 134* 119*  BUN 34* 27* 18  CREATININE 3.02* 1.54* 1.12  CALCIUM 8.7* 8.4* 8.5*    Liver Function Tests:  Recent Labs Lab 10/01/16 1231  AST 108*  ALT 38  ALKPHOS 64  BILITOT 0.7  PROT 7.0  ALBUMIN 3.4*   No results for input(s): LIPASE, AMYLASE in the last 168 hours. No results for input(s): AMMONIA in the last 168 hours.  CBC:  Recent Labs Lab 10/01/16 1231 10/02/16 0518 10/03/16 0636  WBC 21.1* 15.5* 9.7  NEUTROABS 18.7*  --   --   HGB 12.9* 12.1* 11.0*  HCT 40.4 37.2* 33.9*  MCV 72.8* 71.8* 72.4*  PLT 148* 142* 143*    Cardiac Enzymes: No results for input(s):  CKTOTAL, CKMB, CKMBINDEX, TROPONINI in the last 168 hours.  BNP: Invalid input(s): POCBNP  CBG: No results for input(s): GLUCAP in the last 168 hours.  Microbiology: Results for orders placed or performed during the hospital encounter of 10/01/16  Urine culture     Status: None   Collection Time: 10/01/16 12:31 PM  Result Value Ref Range Status   Specimen Description URINE, CATHETERIZED  Final   Special Requests NONE  Final   Culture NO GROWTH Performed at [facility]   Final   Report Status 10/03/2016 FINAL  Final    Coagulation Studies: No results for input(s): LABPROT, INR in the last 72 hours.  Urinalysis:  Recent Labs  10/01/16 1231  COLORURINE YELLOW*  LABSPEC 1.015  PHURINE 5.0  GLUCOSEU 50*  HGBUR LARGE*  BILIRUBINUR NEGATIVE  KETONESUR NEGATIVE  PROTEINUR 100*  NITRITE NEGATIVE  LEUKOCYTESUR SMALL*      Imaging: US Renal  Result Date: 10/01/2016 CLINICAL DATA:  Acute renal failure. EXAM: RENAL / URINARY TRACT ULTRASOUND COMPLETE COMPARISON:  01/26/2014. FINDINGS: Right Kidney: Length: 10.4 cm. Echogenicity within normal limits. No mass or hydronephrosis visualized. Left Kidney: Length: 10.5 cm. Echogenicity within normal limits. No hydronephrosis visualized. 4.9 cm simple cyst midportion left kidney. Bladder: Foley is present in the bladder. Bladder is nondistended. Bladder wall is thickened. This could be the from lack of distention however bladder  pathology including cystitis cannot be excluded. IMPRESSION: 1. Renal echotexture normal. No hydronephrosis. Simple cysts left kidney. 2. Foley catheter noted bladder. The bladder is nondistended. Bladder wall is thickened. This could be from nondistended state. Active bladder pathology including cystitis cannot be excluded. Electronically Signed   By: Maisie Fushomas  Register   On: 10/01/2016 16:41     Medications:   . sodium chloride 75 mL/hr at 10/03/16 0707   . aspirin EC  81 mg Oral Daily  . cefTRIAXone  (ROCEPHIN) IVPB 1 gram/50 mL D5W  1 g Intravenous Q24H  . finasteride  5 mg Oral Daily  . heparin  5,000 Units Subcutaneous Q8H  . tamsulosin  0.4 mg Oral Daily   acetaminophen **OR** acetaminophen, ondansetron **OR** ondansetron (ZOFRAN) IV, traMADol, trolamine salicylate  Assessment/ Plan:  81 y.o. male with No significant past medical history who [facility] for evaluation of Urinary retention.  1.  Acute renal failure likely secondary to acute urinary retention. 2.  Urinary retention. 3.  Urinary tract infection due to #2.  Plan:  Renal function has improved significantly with relief of urinary retention. Creatinine down to 1.1. Urine culture thus far negative however patient had significant hematuria and pyuria. Okay to continue ceftriaxone for 1 additional day. Agree with continuation of Flomax and finasteride. Okay to discontinue normal saline at this time. Otherwise plan per hospitalist.   LOS: 2 Jana Swartzlander 1/13/201811:04 AM

## 2016-10-03 NOTE — Progress Notes (Signed)
Pt d/c home; d/c instructions reviewed w/ pt; pt understanding was verbalized; IV removed, catheter in tact, gauze dressing applied; all pt questions answered; pt verbalized that all pt belongings were accounted for; pt d/c w/ foley in place; pt teaching re foley care done, pt verbalized understanding. Printed materials on home foley care and when to notify MD also given to pt at d/c; pt left unit via wheelchair accompanied by staff

## 2016-10-03 NOTE — Discharge Summary (Addendum)
Elijah Dominguez, is a 81 y.o. male  DOB December 07, 1935  MRN [id number].  Admission date:  10/01/2016  Admitting Physician  Katharina Caper, MD  Discharge Date:  10/03/2016   Primary MD  Barbette Reichmann, MD  Recommendations for primary care physician for things to follow:  With primary doctor next week regarding urinary retention, Foley,   Admission Diagnosis  ARF (acute renal failure) (HCC) [N17.9] Renal insufficiency [N28.9] Acute urinary retention [R33.8]   Discharge Diagnosis  ARF (acute renal failure) (HCC) [N17.9] Renal insufficiency [N28.9] Acute urinary retention [R33.8]   Active Problems:   Acute on chronic renal failure (HCC)   Urinary tract infection   Acute urinary retention   Generalized weakness   Leukocytosis   Diarrhea   Left shoulder pain      History reviewed. No pertinent past medical history.  History reviewed. No pertinent surgical history.     History of present illness and  Hospital Course:     Kindly see H&P for history of present illness and admission details, please review complete Labs, Consult reports and Test reports for all details in brief  HPI  from the history and physical done on the day of admission 81 year old male patient admitted because of renal failure found to have urine retention, UTI.   Hospital Course  81 year old male patient with urinary retention , patient noted to have 1 L of urine on bladder scan, folic catheter placed, admitted to hospitalist service, noted to have urine infection so started on IV Rocephin. She also received IV fluids because of acute renal failure with creatinine of 3 on admission. Patient to renal function improved after starting the patient on Foley catheter, Flomax, finasteride. Patient's creatinine 1.12 today. And Foley draining clear yellow  urine. WBC normalized, patient WBC 21 on admission and today it is 9.7.. No fever. Urine cultures didn't show any growth. Today the plan is to discharge home with Foley catheter, Flomax, finasteride, Keflex. Patient needs voiding trials as an outpatient continue Flomax, finasteride, discussed this with the patient and he understood all the instructions. Patient is to see his primary doctor next week and also urologist from [facility] urology for voiding trials, further treatment for his urinary retention. Patient seen by nephrology while in the hospital.    Discharge Condition: stable   Follow UP  Follow-up Information    HANDE,VISHWANATH, MD Follow up in 3 day(s).   Specialty:  Internal Medicine Contact information: [address] [place] [address] [phone]             Discharge Instructions  and  Discharge Medications     Discharge Instructions    Urinary leg bag    Complete by:  As directed      Allergies as of 10/03/2016   No Known Allergies     Medication List    TAKE these medications   amLODipine 5 MG tablet Commonly known as:  NORVASC Take 1 tablet (5 mg total) by mouth daily.   aspirin EC 81 MG tablet Take 81 mg by mouth daily.   cephALEXin 500 MG capsule Commonly known as:  KEFLEX Take 1 capsule (500 mg total) by mouth 2 (two) times daily.   finasteride 5 MG tablet Commonly known as:  PROSCAR Take 1 tablet (5 mg total) by mouth daily. Start taking on:  10/04/2016   tamsulosin 0.4 MG Caps capsule Commonly known as:  FLOMAX Take 1 capsule (0.4 mg total) by mouth daily. Start taking on:  10/04/2016   trolamine salicylate 10 % cream Commonly known as:  ASPERCREME Apply topically 2 (two) times daily as needed for muscle pain (Left shoulder).         Diet and Activity recommendation: See Discharge Instructions above   Consults obtained -  nephrology   Major procedures and Radiology Reports - PLEASE review  detailed and final reports for all details, in brief -     [place] Renal  Result Date: 10/01/2016 CLINICAL DATA:  Acute renal failure. EXAM: RENAL / URINARY TRACT ULTRASOUND COMPLETE COMPARISON:  01/26/2014. FINDINGS: Right Kidney: Length: 10.4 cm. Echogenicity within normal limits. No mass or hydronephrosis visualized. Left Kidney: Length: 10.5 cm. Echogenicity within normal limits. No hydronephrosis visualized. 4.9 cm simple cyst midportion left kidney. Bladder: Foley is present in the bladder. Bladder is nondistended. Bladder wall is thickened. This could be the from lack of distention however bladder pathology including cystitis cannot be excluded. IMPRESSION: 1. Renal echotexture normal. No hydronephrosis. Simple cysts left kidney. 2. Foley catheter noted bladder. The bladder is nondistended. Bladder wall is thickened. This could be from nondistended state. Active bladder pathology including cystitis cannot be excluded. Electronically Signed   By: Maisie Fushomas  Register   On: 10/01/2016 16:41    Micro Results     Recent Results (from the past 240 hour(s))  Urine culture     Status: None   Collection Time: 10/01/16 12:31 PM  Result Value Ref Range Status   Specimen Description URINE, CATHETERIZED  Final   Special Requests NONE  Final   Culture NO GROWTH [facility]   Final   Report Status 10/03/2016 FINAL  Final       Today   Subjective:   Elijah Dominguez today has no headache,no chest abdominal pain,no new weakness tingling or numbness, feels much better wants to go home today.   Objective:   Blood pressure (!) 177/68, pulse 69, temperature 97.8 F (36.6 C), temperature source Axillary, resp. rate 18, height 6\' 2"  (1.88 m), weight 86.2 kg (190 lb), SpO2 100 %.   Intake/Output Summary (Last 24 hours) at 10/03/16 1120 Last data filed at 10/03/16 0951  Gross per 24 hour  Intake             1624 ml  Output             1425 ml  Net              199 ml     Exam Awake Alert, Oriented x 3, No new F.N deficits, Normal affect [place].AT,PERRAL Supple Neck,No JVD, No cervical lymphadenopathy appriciated.  Symmetrical Chest wall movement, Good air movement bilaterally, CTAB RRR,No Gallops,Rubs or new Murmurs, No Parasternal Heave +ve B.Sounds, Abd Soft, Non tender, No organomegaly appriciated, No rebound -guarding or rigidity. No Cyanosis, Clubbing or edema, No new Rash or bruise  Data Review   CBC w Diff:  Lab Results  Component Value Date   WBC 9.7 10/03/2016   HGB 11.0 (L) 10/03/2016   HGB 12.1 (L) 01/26/2014   HCT 33.9 (L) 10/03/2016   HCT 37.9 (L) 01/26/2014   PLT 143 (L) 10/03/2016   PLT 162 01/26/2014   LYMPHOPCT 3 10/01/2016   LYMPHOPCT 1.4 01/26/2014   MONOPCT 8 10/01/2016   MONOPCT 0.7 01/26/2014   EOSPCT 0 10/01/2016   EOSPCT 0.2 01/26/2014   BASOPCT 0 10/01/2016   BASOPCT 0.2 01/26/2014    CMP:  Lab Results  Component Value Date   NA 140  10/03/2016   NA 141 01/26/2014   K 3.4 (L) 10/03/2016   K 3.4 (L) 01/26/2014   CL 107 10/03/2016   CL 107 01/26/2014   CO2 25 10/03/2016   CO2 28 01/26/2014   BUN 18 10/03/2016   BUN 21 (H) 01/26/2014   CREATININE 1.12 10/03/2016   CREATININE 1.70 (H) 01/26/2014   PROT 7.0 10/01/2016   PROT 7.3 01/26/2014   ALBUMIN 3.4 (L) 10/01/2016   ALBUMIN 3.5 01/26/2014   BILITOT 0.7 10/01/2016   BILITOT 0.4 01/26/2014   ALKPHOS 64 10/01/2016   ALKPHOS 107 01/26/2014   AST 108 (H) 10/01/2016   AST 26 01/26/2014   ALT 38 10/01/2016   ALT 19 01/26/2014  .   Total Time in preparing paper work, data evaluation and todays exam - 35 minutes  Jaclene Bartelt M.D on 10/03/2016 at 11:20 AM    Note: This dictation was prepared with Dragon dictation along with smaller phrase technology. Any transcriptional errors that result from this process are unintentional.

## 2016-10-07 ENCOUNTER — Ambulatory Visit: Payer: Self-pay | Admitting: Urology

## 2016-10-09 ENCOUNTER — Ambulatory Visit: Payer: Self-pay | Admitting: Urology

## 2016-10-12 ENCOUNTER — Encounter: Payer: Self-pay | Admitting: Urology

## 2016-10-12 ENCOUNTER — Ambulatory Visit (INDEPENDENT_AMBULATORY_CARE_PROVIDER_SITE_OTHER): Payer: Medicare PPO | Admitting: Urology

## 2016-10-12 VITALS — BP 177/72 | HR 60 | Ht 75.0 in | Wt 171.0 lb

## 2016-10-12 DIAGNOSIS — R339 Retention of urine, unspecified: Secondary | ICD-10-CM | POA: Diagnosis not present

## 2016-10-12 NOTE — Progress Notes (Signed)
10/12/2016 10:07 AM   Elijah Dominguez 09/28/1935 [id number]  Referring provider: Barbette Reichmann, MD [address] [facility] [place], [place] [address]  Chief Complaint  Patient presents with  . Urinary Retention    HPI: 81 year old male presents today for follow-up from the emergency department where he was seen for urinary retention. At the time the patient was also having significant diarrhea. He was noted to have an on known amount his bladder. His creatinine was 3.0 (baseline 1.2). The patient had a urine culture performed at the time which was normal. His ultrasound was also demonstrated no evidence of hydronephrosis. The patient was started on finasteride and Flomax which he has been on now for 10 days.  The patient was given a chance to void prior to me entering the room and he was unsuccessful.  Baseline, the patient voids 6 times per night. He strains to void. He has incomplete bladder emptying. He was not taking any medications for this in the past. He has not seen or followed up with a primary care provider in some time.  The patient has no significant family history of prostate cancer.     PMH: Past Medical History:  Diagnosis Date  . Hypertension     Surgical History: History reviewed. No pertinent surgical history.  Home Medications:  Allergies as of 10/12/2016   No Known Allergies     Medication List       Accurate as of 10/12/16 10:07 AM. Always use your most recent med list.          amLODipine 5 MG tablet Commonly known as:  NORVASC Take 1 tablet (5 mg total) by mouth daily.   aspirin EC 81 MG tablet Take 81 mg by mouth daily.   cephALEXin 500 MG capsule Commonly known as:  KEFLEX Take 1 capsule (500 mg total) by mouth 2 (two) times daily.   finasteride 5 MG tablet Commonly known as:  PROSCAR Take 1 tablet (5 mg total) by mouth daily.   tamsulosin 0.4 MG Caps capsule Commonly known as:  FLOMAX Take 1 capsule (0.4 mg  total) by mouth daily.   trolamine salicylate 10 % cream Commonly known as:  ASPERCREME Apply topically 2 (two) times daily as needed for muscle pain (Left shoulder).       Allergies: No Known Allergies  Family History: Family History  Problem Relation Age of Onset  . Prostate cancer Neg Hx   . Bladder Cancer Neg Hx   . Kidney cancer Neg Hx     Social History:  reports that he has never smoked. He has never used smokeless tobacco. He reports that he does not drink alcohol or use drugs.  ROS: UROLOGY Frequent Urination?: No Hard to postpone urination?: No Burning/pain with urination?: No Get up at night to urinate?: Yes Leakage of urine?: No Urine stream starts and stops?: No Trouble starting stream?: No Do you have to strain to urinate?: No Blood in urine?: No Urinary tract infection?: Yes Sexually transmitted disease?: No Injury to kidneys or bladder?: No Painful intercourse?: No Weak stream?: No Erection problems?: No Penile pain?: No  Gastrointestinal Nausea?: No Vomiting?: No Indigestion/heartburn?: No Diarrhea?: No Constipation?: Yes  Constitutional Fever: No Night sweats?: No Weight loss?: No Fatigue?: No  Skin Skin rash/lesions?: No Itching?: No  Eyes Blurred vision?: No Double vision?: No  Ears/Nose/Throat Sore throat?: No Sinus problems?: No  Hematologic/Lymphatic Swollen glands?: No Easy bruising?: No  Cardiovascular Leg swelling?: No Chest pain?:  No  Respiratory Cough?: No Shortness of breath?: No  Endocrine Excessive thirst?: No  Musculoskeletal Back pain?: No Joint pain?: Yes  Neurological Headaches?: No Dizziness?: No  Psychologic Depression?: No Anxiety?: No  Physical Exam: BP (!) 177/72   Pulse 60   Ht 6\' 3"  (1.905 m)   Wt 77.6 kg (171 lb)   BMI 21.37 kg/m   Constitutional:  Alert and oriented, No acute distress. HEENT: [place] AT, moist mucus membranes.  Trachea midline, no masses. Cardiovascular: No  clubbing, cyanosis, or edema. Respiratory: Normal respiratory effort, no increased work of breathing. GI: Abdomen is soft, nontender, nondistended, no abdominal masses GU: No CVA tenderness. The patient has bilateral inguinal hernias which I was unable to reduce. However he is asymptomatic and states these as been present for a long time. DRE: The patient's prostate is smooth, symmetric, no nodules, plus 2 in size Skin: No rashes, bruises or suspicious lesions. Lymph: No cervical or inguinal adenopathy. Neurologic: Grossly intact, no focal deficits, moving all 4 extremities. Psychiatric: Normal mood and affect.  Laboratory Data: Lab Results  Component Value Date   WBC 9.7 10/03/2016   HGB 11.0 (L) 10/03/2016   HCT 33.9 (L) 10/03/2016   MCV 72.4 (L) 10/03/2016   PLT 143 (L) 10/03/2016    Lab Results  Component Value Date   CREATININE 1.12 10/03/2016    No results found for: PSA  No results found for: TESTOSTERONE  No results found for: HGBA1C  Urinalysis    Component Value Date/Time   COLORURINE YELLOW (A) 10/01/2016 1231   APPEARANCEUR TURBID (A) 10/01/2016 1231   APPEARANCEUR Cloudy 01/26/2014 0137   LABSPEC 1.015 10/01/2016 1231   LABSPEC 1.009 01/26/2014 0137   PHURINE 5.0 10/01/2016 1231   GLUCOSEU 50 (A) 10/01/2016 1231   GLUCOSEU 50 mg/dL 09/81/191405/04/2014 [id number]   HGBUR LARGE (A) 10/01/2016 1231   BILIRUBINUR NEGATIVE 10/01/2016 1231   BILIRUBINUR Negative 01/26/2014 0137   KETONESUR NEGATIVE 10/01/2016 1231   PROTEINUR 100 (A) 10/01/2016 1231   NITRITE NEGATIVE 10/01/2016 1231   LEUKOCYTESUR SMALL (A) 10/01/2016 1231   LEUKOCYTESUR 3+ 01/26/2014 0137    Pertinent Imaging: I reviewed the patient's ultrasound report of his kidneys demonstrating a normal echotexture and no hydronephrosis.  Assessment & Plan:  81 year old male with obstructive voiding symptoms who developed urinary retention and associated acute renal failure. His prostate felt normal to me, there is  no PSA on record. Etiology of his retention is almost certainly obstructive prostate.  We discussed the treatment options, at this time the patient would like to return in a few hours after given a longer time to void. If it he fails, we will plan to replace the catheter and give him another voiding trial next week. If he fails at that time, the patient will need to be set up for cystoscopy so that we can plan for a bladder outlet obstruction procedure. We did discuss surgery briefly, the patient was interested in considering the saphenous fails a voiding trial. He was not interested in clean intermittent catheterization although he may be more interested if he fails next week.  There are no diagnoses linked to this encounter.  No Follow-up on file.  Crist FatHERRICK, Jerzie Bieri W, MD  [facility] [address], [address] [place], [place] [address] [phone]

## 2016-10-12 NOTE — Progress Notes (Addendum)
Fill and Pull Catheter Removal  Patient is present today for a catheter removal.  Patient was cleaned and prepped in a sterile fashion 300 ml of sterile water/ saline was instilled into the bladder when the patient felt the urge to urinate. 8 ml of water was then drained from the balloon.  A 16 FR foley cath was removed from the bladder no complications were noted .  Patient as then given some time to void on their own.  Patient cannot void  0 ml on their own after some time. The pt was sent home with the instruction to drink plenty of fluids and return at 1:30pm for bladder scan.   The pt returned very uncomfortable w/ 1000cc in her bladder.  Due to urinary retention patient is present today for a foley cath placement.  Patient was cleaned and prepped in a sterile fashion with betadine and lidocaine jelly 2% was instilled into the urethra.  A 16 coude foley catheter was inserted, urine return was noted  1000ml, urine was yellow in color.  The balloon was filled with 10cc of sterile water.  A night bag was attached for drainage.   Patient was given instruction on proper catheter care.  Patient tolerated well, no complications were noted   Preformed by: Burt KnackKRussell,CMA  Additional notes/ Follow up: 1 week, Nurse Visit

## 2016-10-19 ENCOUNTER — Ambulatory Visit: Payer: Medicare PPO

## 2016-10-20 ENCOUNTER — Ambulatory Visit (INDEPENDENT_AMBULATORY_CARE_PROVIDER_SITE_OTHER): Payer: Medicare PPO

## 2016-10-20 VITALS — BP 174/75 | HR 57 | Ht 75.0 in | Wt 170.0 lb

## 2016-10-20 DIAGNOSIS — R339 Retention of urine, unspecified: Secondary | ICD-10-CM

## 2016-10-20 NOTE — Progress Notes (Signed)
Fill and Pull Catheter Removal  Patient is present today for a catheter removal.  Patient was cleaned and prepped in a sterile fashion 500ml of sterile water/ saline was instilled into the bladder when the patient felt the urge to urinate. 10ml of water was then drained from the balloon.  A 16FR coude foley cath was removed from the bladder no complications were noted.  Patient as then given some time to void on their own.  Patient cannot void . Patient tolerated well.  Follow up/ Additional notes: Due to pt not being able to urinate foley was replaced.  Simple Catheter Placement  Due to urinary retention patient is present today for a foley cath placement.  Patient was cleaned and prepped in a sterile fashion with betadine and lidocaine jelly 2% was instilled into the urethra.  A 16 FR coude foley catheter was inserted, urine return was noted  450ml, urine was clear yellow in color.  The balloon was filled with 10cc of sterile water.  A night bag was attached for drainage.  Patient was given instruction on proper catheter care.  Patient tolerated well, no complications were noted   Preformed by: Rupert Stackshelsea Rhianne Soman, LPN   Pt will f/u in 24mo with Dr. Marlou PorchHerrick for cysto and discuss possible surgery.  Blood pressure (!) 174/75, pulse (!) 57, height 6\' 3"  (1.905 m), weight 170 lb (77.1 kg).

## 2016-10-23 ENCOUNTER — Ambulatory Visit: Payer: Medicare PPO

## 2016-10-23 DIAGNOSIS — R339 Retention of urine, unspecified: Secondary | ICD-10-CM

## 2016-10-23 NOTE — Progress Notes (Signed)
Pt presented today with c/o catheter coming out of penis. Nurse inspected catheter to find it in tact and draining properly. Reinforced with pt how catheter is inserted. Pt voiced understanding.

## 2016-10-28 ENCOUNTER — Other Ambulatory Visit: Payer: Medicare PPO | Admitting: Urology

## 2016-10-29 ENCOUNTER — Encounter: Payer: Self-pay | Admitting: Urology

## 2016-10-29 ENCOUNTER — Ambulatory Visit (INDEPENDENT_AMBULATORY_CARE_PROVIDER_SITE_OTHER): Payer: Medicare PPO | Admitting: Urology

## 2016-10-29 VITALS — BP 162/63 | HR 50 | Ht 75.0 in | Wt 166.0 lb

## 2016-10-29 DIAGNOSIS — R338 Other retention of urine: Secondary | ICD-10-CM | POA: Diagnosis not present

## 2016-10-29 DIAGNOSIS — N401 Enlarged prostate with lower urinary tract symptoms: Secondary | ICD-10-CM

## 2016-10-29 DIAGNOSIS — N138 Other obstructive and reflux uropathy: Secondary | ICD-10-CM

## 2016-10-29 MED ORDER — LIDOCAINE HCL 2 % EX GEL
1.0000 "application " | Freq: Once | CUTANEOUS | Status: AC
  Administered 2016-10-29: 1 via URETHRAL

## 2016-10-29 MED ORDER — CIPROFLOXACIN HCL 500 MG PO TABS
500.0000 mg | ORAL_TABLET | Freq: Once | ORAL | Status: AC
  Administered 2016-10-29: 500 mg via ORAL

## 2016-10-29 NOTE — Progress Notes (Signed)
10/29/16  CC:  Chief Complaint  Patient presents with  . Cysto    HPI: 81 yo M with urinary retention s/p multiple failed VT who presents today cystoscopy further evaluation of his outlet.    Retention episodes associated with acute renal failure with Cr up to 3.0.    Enlarge gland without nodules on DRE in 09/2016 by Dr. Marlou Porch.  Most recent PSA 5.86 on 10/19/16 with Foley in place after recent episode of retention.    CT  from 5/15 shows thickened bladder wall and enlarged prostate gland with median lobe.  Calculated volume at that time ~ 50 cc.    Past Medical History:  Diagnosis Date  . Hypertension    No past surgical history on file.  Current Meds  Medication Sig  . amLODipine (NORVASC) 5 MG tablet Take 1 tablet (5 mg total) by mouth daily.  Marland Kitchen aspirin EC 81 MG tablet Take 81 mg by mouth daily.   . finasteride (PROSCAR) 5 MG tablet Take 1 tablet (5 mg total) by mouth daily.  . tamsulosin (FLOMAX) 0.4 MG CAPS capsule Take 1 capsule (0.4 mg total) by mouth daily.  Marland Kitchen trolamine salicylate (ASPERCREME) 10 % cream Apply topically 2 (two) times daily as needed for muscle pain (Left shoulder).  Marland Kitchen VITAMIN K PO Take by mouth.   No Known Allergies   Blood pressure (!) 162/63, pulse (!) 50, height 6\' 3"  (1.905 m), weight 166 lb (75.3 kg). NED. A&Ox3.   No respiratory distress   Abd soft, NT, ND Normal phallus with bilateral descended testicles  Cystoscopy Procedure Note  Patient identification was confirmed, informed consent was obtained, and patient was prepped using Betadine solution.  Lidocaine jelly was administered per urethral meatus.    Preoperative abx where received prior to procedure.     Pre-Procedure: - Inspection reveals a normal caliber ureteral meatus.  Procedure: The flexible cystoscope was introduced without difficulty - No urethral strictures/lesions are present. - Enlarged prostate bilobar coaptation, 5 cm prostatic length - Mildly elevated  bladder neck - Bilateral ureteral orifices identified - Bladder mucosa  reveals no ulcers, tumors, or lesions - No bladder stones - Minimal trabeculation  Retroflexion shows slight intravesical protrusion without significant median lobe.   Post-Procedure: - Patient tolerated the procedure well  Assessment/ Plan:.  1. Acute urinary retention S/p multiple failed voiding trials Enlarged prostate appreciated today on cystoscopy with bilobar coaptation BPH appreciated on previous CT scan Differential diagnosis discussed with patient including bladder outlet obstruction secondary to prostatomegaly versus hypo-contractile bladder versus combination of the latter Lengthy discussion today with he and his wife regarding treatment options moving forward. Urodynamics was reviewed in detail vs. possibility of outlet procedure Specifically, options of TURP versus laser ablation of the prostate were reviewed in detail for which he appears to be a good candidate. Risks including bleeding, infection, damage to surrounding structures, incontinence, worsening of his irritative voiding symptoms, need for Foley catheter, retrograde ejaculation, stricture and bladder neck contracture were reviewed in detail. In addition, management options for his chronic urinary retention were discussed including permanent indwelling Foley catheter, suprapubic tube,  Vs. clean intermittent catheterization. At this time, he would like to continue Foley catheter will discuss this further with his daughter. He will let us know how he like to proceed. - ciprofloxacin (CIPRO) tablet 500 mg; Take 1 tablet (500 mg total) by mouth once. - lidocaine (XYLOCAINE) 2 % jelly 1 application; Place 1 application into the urethra once.  2. BPH  with obstruction/lower urinary tract symptoms As above Continue flomax/ finasteride  Return in about 4 weeks (around 11/26/2016) for Foley change.   Vanna ScotlandAshley Lavanna Rog, MD

## 2016-11-09 ENCOUNTER — Other Ambulatory Visit: Payer: Medicare PPO

## 2016-11-10 ENCOUNTER — Other Ambulatory Visit: Payer: Self-pay | Admitting: Radiology

## 2016-11-10 ENCOUNTER — Telehealth: Payer: Self-pay | Admitting: Radiology

## 2016-11-10 DIAGNOSIS — R339 Retention of urine, unspecified: Secondary | ICD-10-CM

## 2016-11-10 DIAGNOSIS — N138 Other obstructive and reflux uropathy: Secondary | ICD-10-CM

## 2016-11-10 DIAGNOSIS — N401 Enlarged prostate with lower urinary tract symptoms: Principal | ICD-10-CM

## 2016-11-10 NOTE — Telephone Encounter (Signed)
Notified pt's daughter, Saundra Shellingracey Exline, of surgery scheduled with Dr Apolinar JunesBrandon on 11/17/16, pre-admit testing appt on 11/12/16 @10 :45 & to call day prior to surgery for arrival time to SDS. [name] voices understanding.

## 2016-11-12 ENCOUNTER — Encounter
Admission: RE | Admit: 2016-11-12 | Discharge: 2016-11-12 | Disposition: A | Discharge status: Home / Self Care | Payer: Medicare PPO | Source: Ambulatory Visit | Attending: Urology | Admitting: Urology

## 2016-11-12 DIAGNOSIS — I10 Essential (primary) hypertension: Secondary | ICD-10-CM | POA: Insufficient documentation

## 2016-11-12 DIAGNOSIS — R531 Weakness: Secondary | ICD-10-CM | POA: Insufficient documentation

## 2016-11-12 DIAGNOSIS — N189 Chronic kidney disease, unspecified: Secondary | ICD-10-CM | POA: Insufficient documentation

## 2016-11-12 DIAGNOSIS — Z01810 Encounter for preprocedural cardiovascular examination: Secondary | ICD-10-CM | POA: Insufficient documentation

## 2016-11-12 DIAGNOSIS — R001 Bradycardia, unspecified: Secondary | ICD-10-CM | POA: Insufficient documentation

## 2016-11-12 DIAGNOSIS — M25512 Pain in left shoulder: Secondary | ICD-10-CM | POA: Insufficient documentation

## 2016-11-12 DIAGNOSIS — Z01812 Encounter for preprocedural laboratory examination: Secondary | ICD-10-CM | POA: Insufficient documentation

## 2016-11-12 DIAGNOSIS — R197 Diarrhea, unspecified: Secondary | ICD-10-CM | POA: Diagnosis not present

## 2016-11-12 DIAGNOSIS — D72829 Elevated white blood cell count, unspecified: Secondary | ICD-10-CM | POA: Diagnosis not present

## 2016-11-12 DIAGNOSIS — I447 Left bundle-branch block, unspecified: Secondary | ICD-10-CM | POA: Diagnosis not present

## 2016-11-12 DIAGNOSIS — R338 Other retention of urine: Secondary | ICD-10-CM | POA: Diagnosis not present

## 2016-11-12 HISTORY — DX: Unspecified osteoarthritis, unspecified site: M19.90

## 2016-11-12 LAB — URINALYSIS, ROUTINE W REFLEX MICROSCOPIC
BILIRUBIN URINE: NEGATIVE
Glucose, UA: NEGATIVE mg/dL
Ketones, ur: NEGATIVE mg/dL
NITRITE: NEGATIVE
PROTEIN: NEGATIVE mg/dL
SQUAMOUS EPITHELIAL / LPF: NONE SEEN
Specific Gravity, Urine: 1.015 (ref 1.005–1.030)
pH: 6 (ref 5.0–8.0)

## 2016-11-12 LAB — CBC
HEMATOCRIT: 33.8 % — AB (ref 40.0–52.0)
HEMOGLOBIN: 10.9 g/dL — AB (ref 13.0–18.0)
MCH: 23.5 pg — ABNORMAL LOW (ref 26.0–34.0)
MCHC: 32.4 g/dL (ref 32.0–36.0)
MCV: 72.5 fL — AB (ref 80.0–100.0)
Platelets: 187 10*3/uL (ref 150–440)
RBC: 4.66 MIL/uL (ref 4.40–5.90)
RDW: 16.5 % — ABNORMAL HIGH (ref 11.5–14.5)
WBC: 5.2 10*3/uL (ref 3.8–10.6)

## 2016-11-12 NOTE — Pre-Procedure Instructions (Signed)
CARDIAC CLEARANCE/ EKG,AS INSTRUCTED BY DR A KARENZ,CALLED AND FAXED TO PCP DR Marcello FennelHANDE SINCE NO KNOWN CARDIOLOGIST. SPOKE WITH ASHLEY ALSO CALLED AND FAXED TO [name] AT DR Delana MeyerBRANDON'S

## 2016-11-12 NOTE — Patient Instructions (Addendum)
  Your procedure is scheduled on: November 17, 2016 (Tuesday) Report to Same Day Surgery 2nd floor medical mall [facility](Medical Mall Entrance-take elevator on left to 2nd floor.  Check in with surgery information desk.) To find out your arrival time please call [phone] between 1PM - 3PM on November 16, 2016 (Monday)  Remember: Instructions that are not followed completely may result in serious medical risk, up to and including death, or upon the discretion of your surgeon and anesthesiologist your surgery may need to be rescheduled.    _x___ 1. Do not eat food or drink liquids after midnight. No gum chewing or hard candies.     __x__ 2. No Alcohol for 24 hours before or after surgery.   __x__3. No Smoking for 24 prior to surgery.   ____  4. Bring all medications with you on the day of surgery if instructed.    __x__ 5. Notify your doctor if there is any change in your medical condition     (cold, fever, infections).     Do not wear jewelry, make-up, hairpins, clips or nail polish.  Do not wear lotions, powders, or perfumes. You may wear deodorant.  Do not shave 48 hours prior to surgery. Men may shave face and neck.  Do not bring valuables to the hospital.    [facility] is not responsible for any belongings or valuables.               Contacts, dentures or bridgework may not be worn into surgery.  Leave your suitcase in the car. After surgery it may be brought to your room.  For patients admitted to the hospital, discharge time is determined by your treatment team.   Patients discharged the day of surgery will not be allowed to drive home.  You will need someone to drive you home and stay with you the night of your procedure.    Please read over the following fact sheets that you were given:   [facility] Preparing for Surgery and or MRSA Information   _x___ Take these medicines the morning of surgery with A SIP OF WATER:    1. AMLODIPINE  2.  3.  4.  5.  6.  ____Fleets  enema or Magnesium Citrate as directed.   __ Use CHG Soap or sage wipes as directed on instruction sheet   ____ Use inhalers on the day of surgery and bring to hospital day of surgery  ____ Stop metformin 2 days prior to surgery    ____ Take 1/2 of usual insulin dose the night before surgery and none on the morning of           surgery.   _x___ Stop Aspirin, Coumadin, Pllavix ,Eliquis, Effient, or Pradaxa (Patient has stopped Aspirin)  x__ Stop Anti-inflammatories such as Advil, Aleve, Ibuprofen, Motrin, Naproxen,          Naprosyn, Goodies powders or aspirin products.(STOP ASPERCREME NOW)       Ok to take Tylenol.   __x__ Stop supplements until after surgery.  (STOP VITAMIN B-6 NOW)  ____ Bring C-Pap to the hospital.

## 2016-11-13 NOTE — Pre-Procedure Instructions (Signed)
UA and CBC sent to Dr. Apolinar JunesBrandon and Anesthesia for review.

## 2016-11-14 LAB — URINE CULTURE: Culture: >=100000 — AB

## 2016-11-16 ENCOUNTER — Telehealth: Payer: Self-pay

## 2016-11-16 DIAGNOSIS — N390 Urinary tract infection, site not specified: Secondary | ICD-10-CM

## 2016-11-16 MED ORDER — AMPICILLIN 500 MG PO CAPS: 500.0000 mg | ORAL_CAPSULE | Freq: Four times a day (QID) | ORAL | 0 refills | Status: DC

## 2016-11-16 NOTE — Telephone Encounter (Signed)
Notified pt's daughter of +ucx & script for Ampicillin 500 mg po qid x 7 days sent to pharmacy. Advised daughter that because of this, surgery will need to be rescheduled to 11/23/16 & to call Friday prior to surgery for arrival time to SDS. Daughter voices understanding.

## 2016-11-16 NOTE — Telephone Encounter (Signed)
Pt's daughter aware.

## 2016-11-16 NOTE — Telephone Encounter (Signed)
Medication sent to pharmacy. Amy will make pt aware when she is able to reschedule surgery.

## 2016-11-16 NOTE — Telephone Encounter (Signed)
-----   Message from Vanna ScotlandAshley Brandon, MD sent at 11/16/2016  7:47 AM EST ----- Please treat with Ampicillin 500 mg po qid x 7 days.  Vanna ScotlandAshley Brandon, MD

## 2016-11-17 NOTE — Telephone Encounter (Signed)
Notified pt's daughter, Saundra Shellingracey Kardell, that per Dr Marcello FennelHande, pt has been referred to cardiology. Advised of appt made with Dr Juliann Paresallwood on 11/18/16 @11 :00. [name] voices understanding.

## 2016-11-27 ENCOUNTER — Ambulatory Visit: Payer: Medicare PPO

## 2016-11-27 ENCOUNTER — Encounter: Payer: Self-pay | Admitting: Urology

## 2016-11-27 NOTE — Pre-Procedure Instructions (Signed)
Cardiac clearance received from Dr Jeanene Erballwod , and placed on chart (moderate risk).

## 2016-11-29 MED ORDER — CEFAZOLIN SODIUM-DEXTROSE 2-4 GM/100ML-% IV SOLN
2.0000 g | INTRAVENOUS | Status: AC
  Administered 2016-11-30: 2 g via INTRAVENOUS

## 2016-11-30 ENCOUNTER — Ambulatory Visit
Admission: RE | Admit: 2016-11-30 | Discharge: 2016-11-30 | Disposition: A | Discharge status: Home / Self Care | Payer: Medicare PPO | Source: Ambulatory Visit | Attending: Urology | Admitting: Urology

## 2016-11-30 ENCOUNTER — Ambulatory Visit: Payer: Medicare PPO | Admitting: Certified Registered"

## 2016-11-30 ENCOUNTER — Encounter: Payer: Self-pay | Admitting: *Deleted

## 2016-11-30 ENCOUNTER — Encounter
Admission: RE | Disposition: A | Discharge status: Home / Self Care | Payer: Self-pay | Source: Ambulatory Visit | Attending: Urology

## 2016-11-30 ENCOUNTER — Telehealth: Payer: Self-pay

## 2016-11-30 DIAGNOSIS — R338 Other retention of urine: Secondary | ICD-10-CM

## 2016-11-30 DIAGNOSIS — Z79899 Other long term (current) drug therapy: Secondary | ICD-10-CM | POA: Insufficient documentation

## 2016-11-30 DIAGNOSIS — N138 Other obstructive and reflux uropathy: Secondary | ICD-10-CM

## 2016-11-30 DIAGNOSIS — I10 Essential (primary) hypertension: Secondary | ICD-10-CM | POA: Diagnosis not present

## 2016-11-30 DIAGNOSIS — N401 Enlarged prostate with lower urinary tract symptoms: Secondary | ICD-10-CM | POA: Insufficient documentation

## 2016-11-30 DIAGNOSIS — R339 Retention of urine, unspecified: Secondary | ICD-10-CM

## 2016-11-30 DIAGNOSIS — Z7982 Long term (current) use of aspirin: Secondary | ICD-10-CM | POA: Diagnosis not present

## 2016-11-30 HISTORY — PX: THULIUM LASER TURP (TRANSURETHRAL RESECTION OF PROSTATE): SHX6744

## 2016-11-30 SURGERY — THULIUM LASER TURP (TRANSURETHRAL RESECTION OF PROSTATE)
Anesthesia: General | Site: Prostate | Wound class: Clean Contaminated

## 2016-11-30 MED ORDER — ROCURONIUM BROMIDE 100 MG/10ML IV SOLN
INTRAVENOUS | Status: DC | PRN
  Administered 2016-11-30: 40 mg via INTRAVENOUS
  Administered 2016-11-30 (×2): 5 mg via INTRAVENOUS

## 2016-11-30 MED ORDER — LACTATED RINGERS IV SOLN
INTRAVENOUS | Status: DC
  Administered 2016-11-30: 07:00:00 via INTRAVENOUS

## 2016-11-30 MED ORDER — SUGAMMADEX SODIUM 200 MG/2ML IV SOLN
INTRAVENOUS | Status: DC | PRN
  Administered 2016-11-30: 160 mg via INTRAVENOUS

## 2016-11-30 MED ORDER — PROPOFOL 10 MG/ML IV BOLUS
INTRAVENOUS | Status: DC | PRN
  Administered 2016-11-30: 120 mg via INTRAVENOUS

## 2016-11-30 MED ORDER — ONDANSETRON HCL 4 MG/2ML IJ SOLN: 4.0000 mg | Freq: Once | INTRAMUSCULAR | Status: DC | PRN

## 2016-11-30 MED ORDER — PROPOFOL 10 MG/ML IV BOLUS
INTRAVENOUS | Status: AC
  Filled 2016-11-30: qty 20

## 2016-11-30 MED ORDER — LEVOFLOXACIN 500 MG PO TABS: 500.0000 mg | ORAL_TABLET | Freq: Every day | ORAL | 0 refills | Status: DC

## 2016-11-30 MED ORDER — LEVOFLOXACIN IN D5W 500 MG/100ML IV SOLN
INTRAVENOUS | Status: DC | PRN
  Administered 2016-11-30: 500 mg via INTRAVENOUS

## 2016-11-30 MED ORDER — FAMOTIDINE 20 MG PO TABS
20.0000 mg | ORAL_TABLET | Freq: Once | ORAL | Status: AC
  Administered 2016-11-30: 20 mg via ORAL

## 2016-11-30 MED ORDER — LIDOCAINE HCL (CARDIAC) 20 MG/ML IV SOLN
INTRAVENOUS | Status: DC | PRN
  Administered 2016-11-30: 80 mg via INTRAVENOUS

## 2016-11-30 MED ORDER — FAMOTIDINE 20 MG PO TABS
ORAL_TABLET | ORAL | Status: AC
  Administered 2016-11-30: 20 mg via ORAL
  Filled 2016-11-30: qty 1

## 2016-11-30 MED ORDER — DOCUSATE SODIUM 100 MG PO CAPS: 100.0000 mg | ORAL_CAPSULE | Freq: Two times a day (BID) | ORAL | 0 refills | Status: DC

## 2016-11-30 MED ORDER — FENTANYL CITRATE (PF) 100 MCG/2ML IJ SOLN
INTRAMUSCULAR | Status: AC
  Filled 2016-11-30: qty 2

## 2016-11-30 MED ORDER — ONDANSETRON HCL 4 MG/2ML IJ SOLN
INTRAMUSCULAR | Status: DC | PRN
  Administered 2016-11-30: 4 mg via INTRAVENOUS

## 2016-11-30 MED ORDER — CEFAZOLIN SODIUM-DEXTROSE 2-4 GM/100ML-% IV SOLN
INTRAVENOUS | Status: AC
  Administered 2016-11-30: 2 g via INTRAVENOUS
  Filled 2016-11-30: qty 100

## 2016-11-30 MED ORDER — FENTANYL CITRATE (PF) 100 MCG/2ML IJ SOLN: 25.0000 ug | INTRAMUSCULAR | Status: DC | PRN

## 2016-11-30 MED ORDER — LEVOFLOXACIN IN D5W 500 MG/100ML IV SOLN
INTRAVENOUS | Status: AC
  Filled 2016-11-30: qty 100

## 2016-11-30 MED ORDER — FENTANYL CITRATE (PF) 100 MCG/2ML IJ SOLN
INTRAMUSCULAR | Status: DC | PRN
  Administered 2016-11-30: 50 ug via INTRAVENOUS
  Administered 2016-11-30: 100 ug via INTRAVENOUS

## 2016-11-30 MED ORDER — ONDANSETRON HCL 4 MG/2ML IJ SOLN
INTRAMUSCULAR | Status: AC
  Filled 2016-11-30: qty 2

## 2016-11-30 MED ORDER — PHENYLEPHRINE HCL 10 MG/ML IJ SOLN
INTRAMUSCULAR | Status: AC
  Filled 2016-11-30: qty 1

## 2016-11-30 MED ORDER — HYDROCODONE-ACETAMINOPHEN 5-325 MG PO TABS: 1.0000 | ORAL_TABLET | Freq: Four times a day (QID) | ORAL | 0 refills | Status: DC | PRN

## 2016-11-30 MED ORDER — SUCCINYLCHOLINE CHLORIDE 20 MG/ML IJ SOLN
INTRAMUSCULAR | Status: DC | PRN
  Administered 2016-11-30: 100 mg via INTRAVENOUS

## 2016-11-30 MED ORDER — LIDOCAINE HCL (PF) 2 % IJ SOLN
INTRAMUSCULAR | Status: AC
  Filled 2016-11-30: qty 2

## 2016-11-30 SURGICAL SUPPLY — 28 items
ADAPTER IRRIG TUBE 2 SPIKE SOL (ADAPTER) ×6 IMPLANT
ADPR TBG 2 SPK PMP STRL ASCP (ADAPTER) ×2
BAG DRAIN CYSTO-URO LG1000N (MISCELLANEOUS) ×3 IMPLANT
BAG DRN LRG CPC RND TRDRP CNTR (MISCELLANEOUS) ×1
BAG URO DRAIN 4000ML (MISCELLANEOUS) ×3 IMPLANT
CATH FOL 2WAY LX 18X30 (CATHETERS) ×3 IMPLANT
CATH FOL 2WAY LX 24X30 (CATHETERS) IMPLANT
CATH FOL LEG HOLDER (MISCELLANEOUS) ×3 IMPLANT
DRAPE UTILITY 15X26 TOWEL STRL (DRAPES) ×3 IMPLANT
ELECT LOOP 22F BIPOLAR SML (ELECTROSURGICAL)
ELECTRODE LOOP 22F BIPOLAR SML (ELECTROSURGICAL) IMPLANT
GLOVE BIO SURGEON STRL SZ 6.5 (GLOVE) ×2 IMPLANT
GLOVE BIO SURGEONS STRL SZ 6.5 (GLOVE) ×1
GOWN STRL REUS W/ TWL LRG LVL3 (GOWN DISPOSABLE) ×2 IMPLANT
GOWN STRL REUS W/TWL LRG LVL3 (GOWN DISPOSABLE) ×6
HOLDER FOLEY CATH W/STRAP (MISCELLANEOUS) IMPLANT
KIT RM TURNOVER CYSTO AR (KITS) ×3 IMPLANT
LASER REVOLIX PROCEDURE (MISCELLANEOUS) ×3 IMPLANT
LOOP CUT BIPOLAR 24F LRG (ELECTROSURGICAL) IMPLANT
PACK CYSTO AR (MISCELLANEOUS) ×3 IMPLANT
SET IRRIG Y TYPE TUR BLADDER L (SET/KITS/TRAYS/PACK) ×3 IMPLANT
SET IRRIGATING DISP (SET/KITS/TRAYS/PACK) ×3 IMPLANT
SOL .9 NS 3000ML IRR  AL (IV SOLUTION) ×16
SOL .9 NS 3000ML IRR AL (IV SOLUTION) ×8
SOL .9 NS 3000ML IRR UROMATIC (IV SOLUTION) ×8 IMPLANT
SYR TOOMEY 50ML (SYRINGE) IMPLANT
SYRINGE IRR TOOMEY STRL 70CC (SYRINGE) ×3 IMPLANT
WATER STERILE IRR 1000ML POUR (IV SOLUTION) ×3 IMPLANT

## 2016-11-30 NOTE — Anesthesia Post-op Follow-up Note (Cosign Needed)
Anesthesia QCDR form completed.        

## 2016-11-30 NOTE — Op Note (Signed)
Date of procedure: 11/30/16  Preoperative diagnosis:  1. BPH with urinary retention   Postoperative diagnosis:  1. Same as above   Procedure: 1. Laser ablation of prostate  Surgeon: Vanna ScotlandAshley Reshunda Strider, MD  Anesthesia: General  Complications: None  Intraoperative findings: Mildly elevated bladder neck, trilobar coaptation of relatively short prostatic length. Trabeculated bladder.  EBL: minimal  Specimens: none  Drains: 18 French two-way Foley catheter with 30 cc balloon  Indication: Elijah Dominguez is a 81 y.o. patient with history of BPH on maximal medical therapy in urinary retention status post we'll failed voiding trials.  After reviewing the management options for treatment, he elected to proceed with the above surgical procedure(s). We have discussed the potential benefits and risks of the procedure, side effects of the proposed treatment, the likelihood of the patient achieving the goals of the procedure, and any potential problems that might occur during the procedure or recuperation. Informed consent has been obtained.  Description of procedure:  The patient was taken to the operating room and general anesthesia was induced.  The patient was placed in the dorsal lithotomy position, prepped and draped in the usual sterile fashion, and preoperative antibiotics were administered. A preoperative time-out was performed.   A 26 French resectoscope was advanced using a blunt obturator into the bladder without difficulty. Using the 30 lens and laser bridge, and 800  laser fiber was brought in. The bladder was carefully inspected and noted to have a small amount of debris which was irrigated out of the bladder. The bladder was moderately trabeculated. The bladder neck was mildly elevated but no significant median lobe component. The trigone was a fairly decent distance from the bladder neck. The prostate itself was noted to have bilobar coaptation with approximate 4 cm prostatic length.    At this point in time, using a 100 W Thulium laser, 2 incisions were created at the 5:00 and 7:00 position and deepened to bladder neck fibers. These incisions were then carried down to meet in the midline just proximal to the vera montanum. The median lobe and bladder neck were then vaporized using the fiber to create a wide trough and thereby increasing the flow.  Next, attention was turned to the left lateral lobe which was taken down to the same level of the capsule using a sweeping technique. Care was taken to avoid any resection beyond the verumontanum to avoid injury to the sphincter.  Attention was then turned to the left lateral lobe which was taken down to the level of the capsule as well. A small amount of apical tissue was also addressed. Once complete, the prostatic fossa was noted to be widely patent with a large defect. The bladder was then emptied and upon doing so, few small nodular areas were identified and treated. There is no significant bleeding appreciated. Finally, the bladder was drained and all debris was irrigated out. The scope was then removed. An 4618 French two-way Foley catheter was placed. The balloon was filled with 30 cc of sterile water. The patient was then cleaned and dried, repositioned supine position, reversed from anesthesia, taken the PACU in stable condition.  Plan: He'll follow up on Friday for nurse visit voiding trial. He will see me in a partly 4 weeks. He will continue Flomax and finasteride until he is voiding well spontaneously.   Vanna ScotlandAshley Jeremie Abdelaziz, M.D.

## 2016-11-30 NOTE — Discharge Instructions (Signed)
Clean Intermittent Catheterization, Male Clean intermittent catheterization (CIC) is a procedure to remove urine from the bladder by placing a small, flexible tube (catheter) into the bladder though the urethra. The urethra is a tube in the body that carries urine from the bladder out of the body. CIC may be done when:  You cannot completely empty your bladder on your own. This may be due to a blockage in the bladder or urethra.  Your bladder leaks urine. This may happen when the muscles or nerves near the bladder are not working normally, so the bladder overflows. Your health care provider will show you how to perform CIC and will help you to feel comfortable performing this procedure at home. Your health care provider will also help you to get the home care supplies that are needed for this procedure. What supplies will I need?  Germ-free (sterile), water-based lubricant.  A container for urine collection. You may also use the toilet to dispose of urine from the catheter.  A catheter. Your health care provider will determine the best size for you.  Sterile gloves.  Sterile gauze.  Medicated sterile swabs. How do I perform the procedure? Most people need CIC at least 4 times per day to adequately empty the bladder. Your health care provider will tell you how often you should perform CIC. To perform CIC, follow these steps: 1. Wash your hands with soap and water. If soap and water are not available, use hand sanitizer. 2. Prepare the supplies that you will use during the procedure. Open the catheter pack, the lubricant, and the pack of medicated sterile swabs. If you have been told to keep the procedure sterile, do not touch your supplies until you are wearing gloves. 3. Get in a comfortable position. Possible positions include:  Sitting on a toilet, a chair, or the edge of a bed.  Standing near a toilet.  Lying down with your head raised on pillows and your knees pointing to the  ceiling. 4. If you are using a urine collection container, position it between your legs. 5. Urinate, if you are able. 6. Put on gloves. 7. Apply lubricant to about 2 inches (5 cm) of the tip of the catheter. 8. Set the catheter down on a clean, dry surface within reach. 9. Gently stretch your penis out from your body. Pull back any skin that covers the end of your penis (foreskin). Clean the end of your peniswith medicated sterile swabs as told by your health care provider. 10. Hold your penis upward at a 45-60 degree angle. This helps to straighten the urethra. 11. Slowly insert the lubricated catheter 2-3 inches (5-8 cm) straight into your urethra until urine flows freely. Allow urine to drain into the toilet or the urine collection container. 12. When urine starts to flow freely, insert the catheter 1 inch (3 cm) more. 13. When urine stops flowing, slowly remove the catheter. 14. Note the color, amount, and odor of the urine. 15. Clean your penis using soap and water. 16. Wash your hands with soap and water. 17. Follow package instructions about how to clean the catheter after each use. What should I do at home? How Often Should I Perform CIC?   Do CIC to empty your bladder every 4-6 hours or as often as told by your health care provider.  If you have symptoms of too much urine in your bladder (overdistension) and you are not able to urinate, perform CIC. Symptoms of overdistension may include:  Restlessness.  Sweating or chills.  Headache.  Flushed or pale skin.  Cold limbs.  Bloated lower abdomen. What Are Some Steps That I Can Take to Avoid Problems?   Drink enough fluid to keep your urine clear or pale yellow.  Avoid caffeine. Caffeine may make you urinate more frequently and more urgently.  Dispose of a multiple use catheter when it becomes dry, brittle, or cloudy. This usually happens after you use the catheter for 1 week.  Take over-the-counter and prescription  medicines only as told by your health care provider.  Keep all follow-up visits as told by your health care provider. This is important. Contact a health care provider if:  You have difficulty performing CIC.  You have urine leaking during CIC.  You have:  Dark or cloudy urine.  Blood in your urine or in your catheter.  A change in the smell of your urine or discharge.  A burning feeling while you urinate.  You feel nauseous or you vomit.  You have pain in your abdomen, your back, or your sides below your ribs.  You have swelling or redness around the opening of your urethra.  You develop a rash or sores on your skin. Get help right away if:  You have a fever.  You have symptoms that do not go away after 3 days.  You have symptoms that suddenly get worse.  You have severe pain.  The amount of urine that drains from your bladder decreases. This information is not intended to replace advice given to you by your health care provider. Make sure you discuss any questions you have with your health care provider. Document Released: 10/10/2010 Document Revised: 02/13/2016 Document Reviewed: 03/22/2015 Elsevier Interactive Patient Education  2017 [employer] AMBULATORY SURGERY  DISCHARGE INSTRUCTIONS   1) The drugs that you were given will stay in your system until tomorrow so for the next 24 hours you should not:  A) Drive an automobile B) Make any legal decisions C) Drink any alcoholic beverage   2) You may resume regular meals tomorrow.  Today it is better to start with liquids and gradually work up to solid foods.  You may eat anything you prefer, but it is better to start with liquids, then soup and crackers, and gradually work up to solid foods.   3) Please notify your doctor immediately if you have any unusual bleeding, trouble breathing, redness and pain at the surgery site, drainage, fever, or pain not relieved by medication.    4) Additional  Instructions:        Please contact your physician with any problems or Same Day Surgery at [phone], Monday through Friday 6 am to 4 pm, or [place] at [facility] Main number at [phone].

## 2016-11-30 NOTE — Anesthesia Preprocedure Evaluation (Signed)
Anesthesia Evaluation  Patient identified by MRN, date of birth, ID band Patient awake    Reviewed: Allergy & Precautions, NPO status , Patient's Chart, lab work & pertinent test results  Airway Mallampati: II  TM Distance: >3 FB     Dental  (+) Lower Dentures, Upper Dentures   Pulmonary neg pulmonary ROS,    Pulmonary exam normal        Cardiovascular hypertension, Pt. on medications Normal cardiovascular exam     Neuro/Psych negative neurological ROS  negative psych ROS   GI/Hepatic negative GI ROS, Neg liver ROS,   Endo/Other  negative endocrine ROS  Renal/GU Renal disease     Musculoskeletal  (+) Arthritis ,   Abdominal Normal abdominal exam  (+)   Peds negative pediatric ROS (+)  Hematology negative hematology ROS (+)   Anesthesia Other Findings Past Medical History: No date: Arthritis No date: Hypertension Cleared by Dr. Juliann Paresallwood      EF65%...within normal limits LV function  Reproductive/Obstetrics                             Anesthesia Physical Anesthesia Plan  ASA: III  Anesthesia Plan: General   Post-op Pain Management:    Induction: Intravenous  Airway Management Planned: Oral ETT  Additional Equipment:   Intra-op Plan:   Post-operative Plan: Extubation in OR  Informed Consent: I have reviewed the patients History and Physical, chart, labs and discussed the procedure including the risks, benefits and alternatives for the proposed anesthesia with the patient or authorized representative who has indicated his/her understanding and acceptance.     Plan Discussed with: CRNA and Surgeon  Anesthesia Plan Comments:         Anesthesia Quick Evaluation

## 2016-11-30 NOTE — Anesthesia Procedure Notes (Signed)
Procedure Name: Intubation Performed by: Lance Muss Pre-anesthesia Checklist: Patient identified, Patient being monitored, Timeout performed, Emergency Drugs available and Suction available Patient Re-evaluated:Patient Re-evaluated prior to inductionOxygen Delivery Method: Circle system utilized Preoxygenation: Pre-oxygenation with 100% oxygen Intubation Type: IV induction Ventilation: Mask ventilation without difficulty Laryngoscope Size: Mac and 4 Grade View: Grade II Tube type: Oral Tube size: 7.5 mm Number of attempts: 1 Airway Equipment and Method: Stylet Placement Confirmation: ETT inserted through vocal cords under direct vision,  positive ETCO2 and breath sounds checked- equal and bilateral Secured at: 23 cm Tube secured with: Tape Dental Injury: Teeth and Oropharynx as per pre-operative assessment

## 2016-11-30 NOTE — Transfer of Care (Signed)
Immediate Anesthesia Transfer of Care Note  Patient: Elijah Dominguez  Procedure(s) Performed: Procedure(s): THULIUM LASER TURP (TRANSURETHRAL RESECTION OF PROSTATE) (N/A)  Patient Location: PACU  Anesthesia Type:General  Level of Consciousness: sedated and responds to stimulation  Airway & Oxygen Therapy: Patient Spontanous Breathing and Patient connected to face mask oxygen  Post-op Assessment: Report given to RN and Post -op Vital signs reviewed and stable  Post vital signs: Reviewed and stable  Last Vitals:  Vitals:   11/30/16 0910 11/30/16 0912  BP: (!) 176/89 (!) 176/89  Pulse: (!) 47 (!) 53  Resp: 17 17  Temp: 36.4 C     Last Pain:  Vitals:   11/30/16 0647  TempSrc: Oral         Complications: No apparent anesthesia complications

## 2016-11-30 NOTE — H&P (Signed)
10/29/16  --> patient seen and examined today on 11/30/16 without change.  S/p cardiac clearance.  +UA appropriately (Enterococucus) treated with ampicillin x 7 days.  RRR.  CTAB.     CC:     Chief Complaint  Patient presents with  . Cysto    HPI: 81 yo M with urinary retention s/p multiple failed VT who presents today cystoscopy further evaluation of his outlet.    Retention episodes associated with acute renal failure with Cr up to 3.0.    Enlarged gland without nodules on DRE in 09/2016 by Dr. Marlou Porch.  Most recent PSA 5.86 on 10/19/16 with Foley in place after recent episode of retention.    CT  from 5/15 shows thickened bladder wall and enlarged prostate gland with median lobe.  Calculated volume at that time ~ 50 cc.        Past Medical History:  Diagnosis Date  . Hypertension    No past surgical history on file.  ActiveMedications      Current Meds  Medication Sig  . amLODipine (NORVASC) 5 MG tablet Take 1 tablet (5 mg total) by mouth daily.  Marland Kitchen aspirin EC 81 MG tablet Take 81 mg by mouth daily.   . finasteride (PROSCAR) 5 MG tablet Take 1 tablet (5 mg total) by mouth daily.  . tamsulosin (FLOMAX) 0.4 MG CAPS capsule Take 1 capsule (0.4 mg total) by mouth daily.  Marland Kitchen trolamine salicylate (ASPERCREME) 10 % cream Apply topically 2 (two) times daily as needed for muscle pain (Left shoulder).  Marland Kitchen VITAMIN K PO Take by mouth.     No Known Allergies   Blood pressure (!) 162/63, pulse (!) 50, height 6\' 3"  (1.905 m), weight 166 lb (75.3 kg). NED. A&Ox3.   No respiratory distress   Abd soft, NT, ND Normal phallus with bilateral descended testicles  Cystoscopy Procedure Note  Patient identification was confirmed, informed consent was obtained, and patient was prepped using Betadine solution.  Lidocaine jelly was administered per urethral meatus.    Preoperative abx where received prior to procedure.     Pre-Procedure: - Inspection reveals a normal  caliber ureteral meatus.  Procedure: The flexible cystoscope was introduced without difficulty - No urethral strictures/lesions are present. - Enlarged prostate bilobar coaptation, 5 cm prostatic length - Mildly elevated bladder neck - Bilateral ureteral orifices identified - Bladder mucosa  reveals no ulcers, tumors, or lesions - No bladder stones - Minimal trabeculation  Retroflexion shows slight intravesical protrusion without significant median lobe.   Post-Procedure: - Patient tolerated the procedure well  Assessment/ Plan:.  1. Acute urinary retention S/p multiple failed voiding trials Enlarged prostate appreciated today on cystoscopy with bilobar coaptation BPH appreciated on previous CT scan Differential diagnosis discussed with patient including bladder outlet obstruction secondary to prostatomegaly versus hypo-contractile bladder versus combination of the latter Lengthy discussion today with he and his wife regarding treatment options moving forward. Urodynamics was reviewed in detail vs. possibility of outlet procedure Specifically, options of TURP versus laser ablation of the prostate were reviewed in detail for which he appears to be a good candidate. Risks including bleeding, infection, damage to surrounding structures, incontinence, worsening of his irritative voiding symptoms, need for Foley catheter, retrograde ejaculation, stricture and bladder neck contracture were reviewed in detail. In addition, management options for his chronic urinary retention were discussed including permanent indwelling Foley catheter, suprapubic tube,  Vs. clean intermittent catheterization. At this time, he would like to continue Foley catheter will discuss this  further with his daughter. He will let us know how he like to proceed. - ciprofloxacin (CIPRO) tablet 500 mg; Take 1 tablet (500 mg total) by mouth once. - lidocaine (XYLOCAINE) 2 % jelly 1 application; Place 1 application  into the urethra once.  2. BPH with obstruction/lower urinary tract symptoms As above Continue flomax/ finasteride  Return in about 4 weeks (around 11/26/2016) for Foley change.   Vanna ScotlandAshley Acelynn Dejonge, MD

## 2016-11-30 NOTE — Anesthesia Postprocedure Evaluation (Signed)
Anesthesia Post Note  Patient: Elijah Dominguez  Procedure(s) Performed: Procedure(s) (LRB): THULIUM LASER TURP (TRANSURETHRAL RESECTION OF PROSTATE) (N/A)  Patient location during evaluation: PACU Anesthesia Type: General Level of consciousness: awake and alert and oriented Pain management: pain level controlled Vital Signs Assessment: post-procedure vital signs reviewed and stable Respiratory status: spontaneous breathing Cardiovascular status: blood pressure returned to baseline Anesthetic complications: no     Last Vitals:  Vitals:   11/30/16 0958 11/30/16 1021  BP: (!) 173/65 (!) 166/60  Pulse: (!) 41 (!) 42  Resp:    Temp: 36.3 C     Last Pain:  Vitals:   11/30/16 0647  TempSrc: Oral                 Duyen Beckom,Bhavin

## 2016-11-30 NOTE — Telephone Encounter (Signed)
-----   Message from Vanna ScotlandAshley Brandon, MD sent at 11/30/2016  9:22 AM EDT ----- Regarding: follow up plan Please arrange for nurse visit VT on Friday AM  Also visit to see me in ~4 weeks for PVR/ Uroflow/ IPSS  Vanna ScotlandAshley Brandon, MD

## 2016-12-01 ENCOUNTER — Encounter: Payer: Self-pay | Admitting: Urology

## 2016-12-01 NOTE — Telephone Encounter (Signed)
done

## 2016-12-04 ENCOUNTER — Ambulatory Visit (INDEPENDENT_AMBULATORY_CARE_PROVIDER_SITE_OTHER): Payer: Medicare PPO

## 2016-12-04 VITALS — BP 182/83 | HR 65 | Ht 75.0 in | Wt 165.7 lb

## 2016-12-04 DIAGNOSIS — N401 Enlarged prostate with lower urinary tract symptoms: Secondary | ICD-10-CM

## 2016-12-04 LAB — BLADDER SCAN AMB NON-IMAGING: Scan Result: 140

## 2016-12-04 NOTE — Progress Notes (Signed)
Fill and Pull Catheter Removal  Patient is present today for a catheter removal.  Patient was cleaned and prepped in a sterile fashion 280ml of sterile water/ saline was instilled into the bladder when the patient felt the urge to urinate. 30ml of water was then drained from the balloon.  A 18FR foley cath was removed from the bladder no complications were noted .  Patient as then given some time to void on their own.  Patient can not void.  Patient tolerated well.  Preformed by: Rupert Stackshelsea Albeiro Trompeter, LPN   Follow up/ Additional notes: pt had a bladder spasm as foley was removed. 120cc were caught in urinal. PVR was 140. Reinforced with pt to drink plenty of water today and if not able to urinate or develops pain to RTC by 3pm. Pt voiced understanding.   Blood pressure (!) 182/83, pulse 65, height 6\' 3"  (1.905 m), weight 165 lb 11.2 oz (75.2 kg).

## 2017-01-01 ENCOUNTER — Ambulatory Visit: Payer: Medicare PPO | Admitting: Urology

## 2017-07-19 DIAGNOSIS — I10 Essential (primary) hypertension: Secondary | ICD-10-CM | POA: Insufficient documentation

## 2017-07-19 DIAGNOSIS — E538 Deficiency of other specified B group vitamins: Secondary | ICD-10-CM | POA: Insufficient documentation

## 2017-07-23 ENCOUNTER — Other Ambulatory Visit: Payer: Self-pay | Admitting: Internal Medicine

## 2017-07-23 DIAGNOSIS — D649 Anemia, unspecified: Secondary | ICD-10-CM

## 2017-08-02 ENCOUNTER — Ambulatory Visit
Admission: RE | Admit: 2017-08-02 | Discharge: 2017-08-02 | Disposition: A | Discharge status: Home / Self Care | Payer: Medicare PPO | Source: Ambulatory Visit | Attending: Internal Medicine | Admitting: Internal Medicine

## 2017-08-02 DIAGNOSIS — N433 Hydrocele, unspecified: Secondary | ICD-10-CM | POA: Diagnosis not present

## 2017-08-02 DIAGNOSIS — D649 Anemia, unspecified: Secondary | ICD-10-CM | POA: Diagnosis not present

## 2017-08-02 DIAGNOSIS — I700 Atherosclerosis of aorta: Secondary | ICD-10-CM | POA: Diagnosis not present

## 2017-08-02 DIAGNOSIS — M419 Scoliosis, unspecified: Secondary | ICD-10-CM | POA: Diagnosis not present

## 2017-08-02 DIAGNOSIS — Q433 Congenital malformations of intestinal fixation: Secondary | ICD-10-CM | POA: Diagnosis not present

## 2017-08-02 DIAGNOSIS — M47816 Spondylosis without myelopathy or radiculopathy, lumbar region: Secondary | ICD-10-CM | POA: Diagnosis not present

## 2017-08-02 DIAGNOSIS — I2510 Atherosclerotic heart disease of native coronary artery without angina pectoris: Secondary | ICD-10-CM | POA: Insufficient documentation

## 2017-08-02 LAB — POCT I-STAT CREATININE: Creatinine, Ser: 1 mg/dL (ref 0.61–1.24)

## 2017-08-02 MED ORDER — IOPAMIDOL (ISOVUE-300) INJECTION 61%
100.0000 mL | Freq: Once | INTRAVENOUS | Status: AC | PRN
  Administered 2017-08-02: 100 mL via INTRAVENOUS

## 2017-09-21 ENCOUNTER — Emergency Department: Payer: Medicare PPO

## 2017-09-21 ENCOUNTER — Encounter: Payer: Self-pay | Admitting: Emergency Medicine

## 2017-09-21 ENCOUNTER — Emergency Department
Admission: EM | Admit: 2017-09-21 | Discharge: 2017-09-21 | Disposition: A | Discharge status: Home / Self Care | Payer: Medicare PPO | Attending: Emergency Medicine | Admitting: Emergency Medicine

## 2017-09-21 DIAGNOSIS — N3001 Acute cystitis with hematuria: Secondary | ICD-10-CM | POA: Insufficient documentation

## 2017-09-21 DIAGNOSIS — I10 Essential (primary) hypertension: Secondary | ICD-10-CM | POA: Diagnosis not present

## 2017-09-21 DIAGNOSIS — Z79899 Other long term (current) drug therapy: Secondary | ICD-10-CM | POA: Diagnosis not present

## 2017-09-21 DIAGNOSIS — Z7982 Long term (current) use of aspirin: Secondary | ICD-10-CM | POA: Insufficient documentation

## 2017-09-21 DIAGNOSIS — W19XXXA Unspecified fall, initial encounter: Secondary | ICD-10-CM | POA: Insufficient documentation

## 2017-09-21 DIAGNOSIS — S0012XA Contusion of left eyelid and periocular area, initial encounter: Secondary | ICD-10-CM | POA: Insufficient documentation

## 2017-09-21 DIAGNOSIS — S0083XA Contusion of other part of head, initial encounter: Secondary | ICD-10-CM

## 2017-09-21 DIAGNOSIS — Y999 Unspecified external cause status: Secondary | ICD-10-CM | POA: Diagnosis not present

## 2017-09-21 DIAGNOSIS — Y939 Activity, unspecified: Secondary | ICD-10-CM | POA: Insufficient documentation

## 2017-09-21 DIAGNOSIS — Y92009 Unspecified place in unspecified non-institutional (private) residence as the place of occurrence of the external cause: Secondary | ICD-10-CM | POA: Diagnosis not present

## 2017-09-21 DIAGNOSIS — S0990XA Unspecified injury of head, initial encounter: Secondary | ICD-10-CM | POA: Diagnosis present

## 2017-09-21 HISTORY — DX: Polyneuropathy, unspecified: G62.9

## 2017-09-21 LAB — URINALYSIS, COMPLETE (UACMP) WITH MICROSCOPIC
Bilirubin Urine: NEGATIVE
Glucose, UA: NEGATIVE mg/dL
KETONES UR: NEGATIVE mg/dL
Nitrite: NEGATIVE
PH: 6 (ref 5.0–8.0)
Protein, ur: NEGATIVE mg/dL
SPECIFIC GRAVITY, URINE: 1.009 (ref 1.005–1.030)

## 2017-09-21 LAB — CBC WITH DIFFERENTIAL/PLATELET
Basophils Absolute: 0 10*3/uL (ref 0–0.1)
Basophils Relative: 1 %
EOS PCT: 1 %
Eosinophils Absolute: 0.1 10*3/uL (ref 0–0.7)
HCT: 35.3 % — ABNORMAL LOW (ref 40.0–52.0)
Hemoglobin: 11.4 g/dL — ABNORMAL LOW (ref 13.0–18.0)
LYMPHS ABS: 0.9 10*3/uL — AB (ref 1.0–3.6)
LYMPHS PCT: 13 %
MCH: 23.6 pg — AB (ref 26.0–34.0)
MCHC: 32.3 g/dL (ref 32.0–36.0)
MCV: 73.1 fL — AB (ref 80.0–100.0)
MONO ABS: 0.6 10*3/uL (ref 0.2–1.0)
Monocytes Relative: 9 %
Neutro Abs: 5 10*3/uL (ref 1.4–6.5)
Neutrophils Relative %: 76 %
PLATELETS: 206 10*3/uL (ref 150–440)
RBC: 4.84 MIL/uL (ref 4.40–5.90)
RDW: 16.2 % — AB (ref 11.5–14.5)
WBC: 6.7 10*3/uL (ref 3.8–10.6)

## 2017-09-21 LAB — BASIC METABOLIC PANEL
ANION GAP: 5 (ref 5–15)
BUN: 17 mg/dL (ref 6–20)
CALCIUM: 8.6 mg/dL — AB (ref 8.9–10.3)
CO2: 30 mmol/L (ref 22–32)
Chloride: 105 mmol/L (ref 101–111)
Creatinine, Ser: 1.17 mg/dL (ref 0.61–1.24)
GFR calc non Af Amer: 57 mL/min — ABNORMAL LOW (ref >60)
Glucose, Bld: 121 mg/dL — ABNORMAL HIGH (ref 65–99)
Potassium: 3.6 mmol/L (ref 3.5–5.1)
Sodium: 140 mmol/L (ref 135–145)

## 2017-09-21 LAB — TROPONIN I

## 2017-09-21 LAB — CK: CK TOTAL: 149 U/L (ref 49–397)

## 2017-09-21 MED ORDER — CEPHALEXIN 500 MG PO CAPS: 500.0000 mg | ORAL_CAPSULE | Freq: Three times a day (TID) | ORAL | 0 refills | Status: AC

## 2017-09-21 MED ORDER — CEPHALEXIN 500 MG PO CAPS
500.0000 mg | ORAL_CAPSULE | Freq: Once | ORAL | Status: AC
  Administered 2017-09-21: 500 mg via ORAL
  Filled 2017-09-21: qty 1

## 2017-09-21 MED ORDER — ACETAMINOPHEN 500 MG PO TABS
1000.0000 mg | ORAL_TABLET | Freq: Once | ORAL | Status: AC
  Administered 2017-09-21: 1000 mg via ORAL
  Filled 2017-09-21: qty 2

## 2017-09-21 NOTE — ED Notes (Signed)
Patient transported to CT 

## 2017-09-21 NOTE — Discharge Instructions (Signed)
You were seen in the emergency department after a fall. Luckily all of your imaging studies did not show any evidence of injuries. Follow-up with you doctor within the next 2-3 days for further evaluation. Sometimes injuries can present at a later time and therefore it is imperative that you return to the emergency room if you have a severe headache, facial droop, neck pain, numbness or weakness of your extremities, slurred speech, difficulty finding words, chest pain, back pain, abdominal pain, or any other new symptoms that were not present during this visit. You may take Tylenol at home for your pain. ° ° °You have been seen in the Emergency Department (ED)  today for a urinary tract infection. ° °Most UTIs are caused by bacteria and need to be treated with antibiotics. It is important to complete your treatment so that the infection does not get worse. Take your antibiotics fully even if your symptoms start to get better after the first few doses. Drink PLENTY of fluids to help clear the infection. ° °Follow-up with your doctor or return to the ER immediately if your symptoms are getting worse, if you develop a fever, if you develop abdominal or flank pain, or if you start to vomit. Otherwise follow up with your doctor in 1 week if your symptoms are improving.  ° °When should you call for help?  °Call your doctor now or seek immediate medical care if:  °Symptoms such as a fever, chills, nausea, or vomiting get worse or happen for the first time.  °You have new pain in your back just below your rib cage. This is called flank pain.  °There is new blood or pus in your urine.  °You are not able to take or keep down your antibiotics. °Your symptoms are not getting better after 48 hours of antibiotic treatment ° °Watch closely for changes in your health, and be sure to contact your doctor if:  °You are not getting better after taking an antibiotic for 2 days.  °Your symptoms go away but then come back. ° ° °How can  you care for yourself at home?  °Take your antibiotics as prescribed. Do not stop taking them just because you feel better. You need to take the full course of antibiotics.  °Take your medicines exactly as prescribed. Your doctor may have prescribed a medicine, such as phenazopyridine (Pyridium), to help relieve pain when you urinate. This turns your urine orange. You may stop taking it when your symptoms get better. But be sure to take all of your antibiotics, which treat the infection.  °Drink extra water and juices such as cranberry and blueberry juices for the next day or two. This will help make the urine less concentrated and help wash out the bacteria causing the infection. (If you have kidney, heart, or liver disease and have to limit your fluids, talk with your doctor before you increase your fluid intake.)  °Avoid drinks that are carbonated or have caffeine. They can irritate the bladder.  °Urinate often. Try to empty your bladder each time.  °To relieve pain, take a hot bath or lay a heating pad (set on low) over your lower belly or genital area. Never go to sleep with a heating pad in place. ° °To help prevent UTIs  °Drink plenty of fluids, enough so that your urine is light yellow or clear like water. If you have kidney, heart, or liver disease and have to limit fluids, talk with your doctor before you increase the   amount of fluids you drink.  °Urinate when you have the urge. Do not hold your urine for a long time. Urinate before you go to sleep.  °Keep your vagina/ penis clean. ° ° °

## 2017-09-21 NOTE — ED Notes (Signed)
Pt in NAD. Family was concerned about pt's respiratory rate on the monitor and the nurse explained the rate and the other things that we look at to keep pt safe. Family was comfortable with explanation.

## 2017-09-21 NOTE — ED Provider Notes (Signed)
[facility] Emergency Department Provider Note  ____________________________________________  Time seen: Approximately 2:48 PM  I have reviewed the triage vital signs and the nursing notes.   HISTORY  Chief Complaint Fall   HPI Elijah Dominguez is a 82 y.o. male with a history of neuropathy, arthritis, HTN who presents for evaluation after a fall. Patient lives with his daughter. This morning he had a witnessed fall where he lost his balance and fell onto his buttocks. His daughter was at home and helped him get up. She left for work at 10 AM and asked her uncle to check on the patient. At 11:15AM the uncle and came to the house and found patient sitting on the ground with his left head leaning on a chair. Patient has sustained a second fall. Was complaining of pain on his face and had swelling on the left periorbital region which prompted the visit to the emergency room. According to the daughter patient has a walker and a cane. He usually doesn't fall often however 2 months ago he had his gabapentin increased to 300mg  3 times a day which was making the patient dizzy. The daughter reports that she told patient to only take 300mg  at bed time. Daughter thinks the patient took another 300 mg this morning as he was up at 5 AM which is atypical for him and then went back to sleep. Patient does not remember taking another pill. Patient remembers falling but is unable to tell me the reason he fell. He denies any pain at this time including facial pain, head pain, neck pain, back pain, extremity pain, chest pain, or abdominal pain  Past Medical History:  Diagnosis Date  . Arthritis   . Hypertension   . Neuropathy     Patient Active Problem List   Diagnosis Date Noted  . Acute on chronic renal failure (HCC) 10/01/2016  . Urinary tract infection 10/01/2016  . Acute urinary retention 10/01/2016  . Generalized weakness 10/01/2016  . Leukocytosis 10/01/2016  . Diarrhea  10/01/2016  . Left shoulder pain 10/01/2016    Past Surgical History:  Procedure Laterality Date  . THULIUM LASER TURP (TRANSURETHRAL RESECTION OF PROSTATE) N/A 11/30/2016   Procedure: THULIUM LASER TURP (TRANSURETHRAL RESECTION OF PROSTATE);  Surgeon: Vanna Scotland, MD;  Location: [facility] ORS;  Service: Urology;  Laterality: N/A;    Prior to Admission medications   Medication Sig Start Date End Date Taking? Authorizing Provider  gabapentin (NEURONTIN) 300 MG capsule Take 300 mg by mouth 3 (three) times daily.   Yes [provider]  amLODipine (NORVASC) 5 MG tablet Take 1 tablet (5 mg total) by mouth daily. 10/03/16   Katha Hamming, MD  aspirin EC 81 MG tablet Take 81 mg by mouth daily.     [provider]  cephALEXin (KEFLEX) 500 MG capsule Take 1 capsule (500 mg total) by mouth 3 (three) times daily for 7 days. 09/21/17 09/28/17  Nita Sickle, MD  docusate sodium (COLACE) 100 MG capsule Take 1 capsule (100 mg total) by mouth 2 (two) times daily. 11/30/16   Vanna Scotland, MD  finasteride (PROSCAR) 5 MG tablet Take 1 tablet (5 mg total) by mouth daily. 10/04/16   Katha Hamming, MD  HYDROcodone-acetaminophen (NORCO/VICODIN) 5-325 MG tablet Take 1-2 tablets by mouth every 6 (six) hours as needed for moderate pain. 11/30/16   Vanna Scotland, MD  levofloxacin (LEVAQUIN) 500 MG tablet Take 1 tablet (500 mg total) by mouth daily. 11/30/16   Vanna Scotland,  MD  Naproxen Sodium (ALEVE) 220 MG CAPS Take 220-440 mg by mouth 3 (three) times daily as needed (for pain.).    [provider]  pramipexole (MIRAPEX) 0.5 MG tablet Take 0.5 mg by mouth at bedtime.    [provider]  pyridOXINE (VITAMIN B-6) 100 MG tablet Take 100 mg by mouth daily.    [provider]  tamsulosin (FLOMAX) 0.4 MG CAPS capsule Take 1 capsule (0.4 mg total) by mouth daily. 10/04/16   Katha Hamming, MD  trolamine salicylate (ASPERCREME) 10 % cream Apply topically 2  (two) times daily as needed for muscle pain (Left shoulder). 10/03/16   Katha Hamming, MD    Allergies Patient has no known allergies.  Family History  Problem Relation Age of Onset  . Diabetes Mother   . Heart attack Father   . Prostate cancer Neg Hx   . Bladder Cancer Neg Hx   . Kidney cancer Neg Hx     Social History Social History   Tobacco Use  . Smoking status: Never Smoker  . Smokeless tobacco: Never Used  Substance Use Topics  . Alcohol use: No  . Drug use: No    Review of Systems Constitutional: Negative for fever. Eyes: Negative for visual changes. ENT: + facial injury. No neck injury Cardiovascular: Negative for chest injury. Respiratory: Negative for shortness of breath. Negative for chest wall injury. Gastrointestinal: Negative for abdominal pain or injury. Genitourinary: Negative for dysuria. Musculoskeletal: Negative for back injury, negative for arm or leg pain. Skin: Negative for laceration/abrasions. Neurological: Negative for head injury.  ____________________________________________   PHYSICAL EXAM:  VITAL SIGNS: ED Triage Vitals  Enc Vitals Group     BP 09/21/17 1350 (!) 171/66     Pulse Rate 09/21/17 1350 (!) 50     Resp 09/21/17 1350 20     Temp 09/21/17 1350 98.5 F (36.9 C)     Temp Source 09/21/17 1350 Oral     SpO2 09/21/17 1350 100 %     Weight 09/21/17 1351 170 lb (77.1 kg)     Height 09/21/17 1351 6' (1.829 m)     Head Circumference --      Peak Flow --      Pain Score --      Pain Loc --      Pain Edu? --      Excl. in GC? --    Constitutional: Alert and oriented x 2. No acute distress. Does not appear intoxicated. HEENT Head: Normocephalic and atraumatic. Face: No facial bony tenderness. Stable midface Ears: No hemotympanum bilaterally. No Battle sign. Eyes: No eye injury. PERRL. No raccoon eyes. L periorbital swelling and L conjunctiva injection. EOMI Nose: Nontender. No epistaxis. No rhinorrhea Mouth/Throat:  Mucous membranes are moist. No oropharyngeal blood. No dental injury. Airway patent without stridor. Normal voice. Neck: no C-collar in place. No midline c-spine tenderness.  Cardiovascular: Normal rate, regular rhythm. Normal and symmetric distal pulses are present in all extremities. Pulmonary/Chest: Chest wall is stable and nontender to palpation/compression. Normal respiratory effort. Breath sounds are normal. No crepitus.  Abdominal: Soft, nontender, non distended. Musculoskeletal: Nontender with normal full range of motion in all extremities. No deformities. No thoracic or lumbar midline spinal tenderness. Pelvis is stable. Skin: Skin is warm, dry and intact. No abrasions or contutions. Psychiatric: Speech and behavior are appropriate. Neurological: Normal speech and language. Moves all extremities to command. No gross focal neurologic deficits are appreciated.  Glascow Coma Score: 4 - Opens eyes  on own 6 - Follows simple motor commands 5 - Alert and oriented GCS: 15  ____________________________________________   LABS (all labs ordered are listed, but only abnormal results are displayed)  Labs Reviewed  CBC WITH DIFFERENTIAL/PLATELET - Abnormal; Notable for the following components:      Result Value   Hemoglobin 11.4 (*)    HCT 35.3 (*)    MCV 73.1 (*)    MCH 23.6 (*)    RDW 16.2 (*)    Lymphs Abs 0.9 (*)    All other components within normal limits  URINALYSIS, COMPLETE (UACMP) WITH MICROSCOPIC - Abnormal; Notable for the following components:   Color, Urine YELLOW (*)    APPearance CLOUDY (*)    Hgb urine dipstick SMALL (*)    Leukocytes, UA LARGE (*)    Bacteria, UA RARE (*)    Squamous Epithelial / LPF 0-5 (*)    All other components within normal limits  BASIC METABOLIC PANEL - Abnormal; Notable for the following components:   Glucose, Bld 121 (*)    Calcium 8.6 (*)    GFR calc non Af Amer 57 (*)    All other components within normal limits  URINE CULTURE  CK   TROPONIN I   ____________________________________________  EKG  ED ECG REPORT I, Nita Sicklearolina Afomia Blackley, the attending physician, personally viewed and interpreted this ECG.  Sinus bradycardia, rate of 49, left bundle branch block, normal QTC, left axis deviation, no ST elevations or depressions. Unchanged from prior.  ____________________________________________  RADIOLOGY  CT head/cspine/face: 1. No acute intracranial abnormality. Atrophy with chronic small vessel white matter ischemic disease. 2. Soft tissue swelling left superior orbital region. No underlying maxillofacial fracture. 3. Surgical and degenerative changes in the cervical spine without acute fracture.  ____________________________________________   PROCEDURES  Procedure(s) performed: None Procedures Critical Care performed:  None ____________________________________________   INITIAL IMPRESSION / ASSESSMENT AND PLAN / ED COURSE  82 y.o. male with a history of neuropathy, arthritis, HTN who presents for evaluation after a unwitnessed fall with facial trauma. patient with left periorbital swelling and left injected conjunctiva, no evidence of traumatic hyphema, extraocular movements are intact and pupils are equal round and reactive. No other obvious trauma based on history and exam. Since fall was unwitnessed and patient doesn't really remember how he fell I will get labs and UA to rule out dehydration, electrolyte abnormalities, cardiac ischemia, or urinary tract infection is possible etiologies for patient's fall. His EKG shows sinus bradycardia with a left bundle branch block which is unchanged from prior. CT head, C-spine, and maxillofacial have been ordered.     _________________________ 4:25 PM on 09/21/2017 -----------------------------------------  labs in no acute findings. UA positive for UTI. Imaging studies with no evidence of injury. Patient was given Keflex. We'll discharge home with a prescription  for Keflex, close follow-up with primary care doctor. Discussed return precautions with family. Encouraged patient using his walker home. Encouraged daughter to keep track of patient's pills and give it to him on a daily basis instead of allowing patient to manage his own medications.   As part of my medical decision making, I reviewed the following data within the electronic MEDICAL RECORD NUMBER History obtained from family, Nursing notes reviewed and incorporated, Labs reviewed , EKG interpreted , Old EKG reviewed, Old chart reviewed, Radiograph reviewed , Notes from prior ED visits and  Controlled Substance Database    Pertinent labs & imaging results that were available during my care of the patient were  reviewed by me and considered in my medical decision making (see chart for details).    ____________________________________________   FINAL CLINICAL IMPRESSION(S) / ED DIAGNOSES  Final diagnoses:  Fall, initial encounter  Contusion of face, initial encounter  Acute cystitis with hematuria      NEW MEDICATIONS STARTED DURING THIS VISIT:  ED Discharge Orders        Ordered    cephALEXin (KEFLEX) 500 MG capsule  3 times daily     09/21/17 1624       Note:  This document was prepared using Dragon voice recognition software and may include unintentional dictation errors.    Don Perking, [place], MD 09/21/17 [phone]

## 2017-09-21 NOTE — ED Triage Notes (Signed)
BG per EMS was 196

## 2017-09-21 NOTE — ED Triage Notes (Signed)
Pt fell twice today. Pt was found around 1230 after a fall. Unsure how long he had been down. Pt has some edema to left peri-orbital region. Pt denying any pain. Weakness is the only complaint at this time. Daughter stating that she is his POA and that pt has mild dementia. Pt in NAD at this time.

## 2017-09-21 NOTE — ED Notes (Signed)
Pt's daughter stating that pt recently had his Gabapentin switched to a higher dose 2 months ago. Pt has not been taking it as prescribed because it make him "feel funny." Daughter stating that he takes 300mg  prior to bed and that is all. Pt appears to be sleepy at this time and is dosing off while nurse is speaking to his daughter. Nurse attempted PIV x2 and has asked Wille CelesteJanie, RN to attempt for labs and PIV access.

## 2017-09-24 LAB — URINE CULTURE

## 2017-09-25 IMAGING — US US RENAL
1 series · 14 of 25 positions shown · non-contrast
Comparison: 01/26/2014.

CLINICAL DATA: Acute renal failure.

EXAM:
RENAL / URINARY TRACT ULTRASOUND COMPLETE

[Series 1: us renal · 0.22mm/px · 14 of 40 slices shown]
[im 1/40]
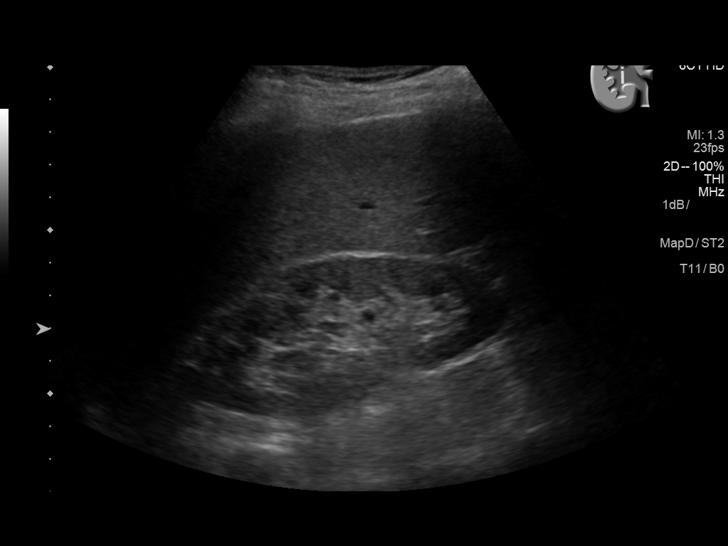
[im 4/40]
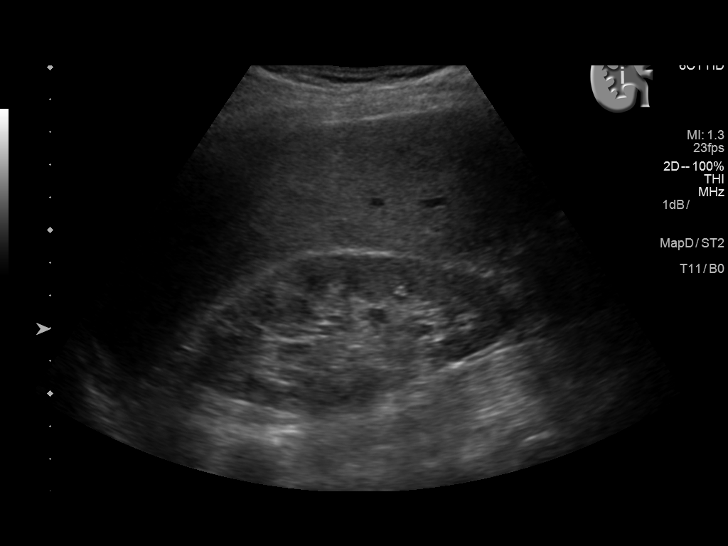
[im 7/40]
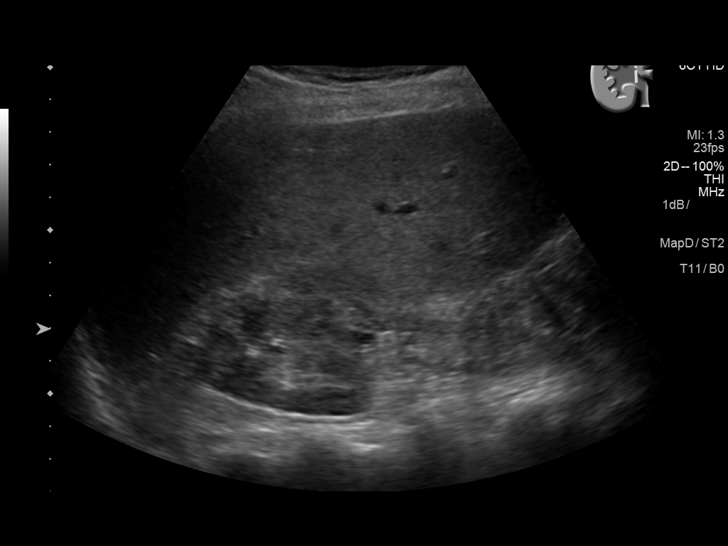
[im 10/40]
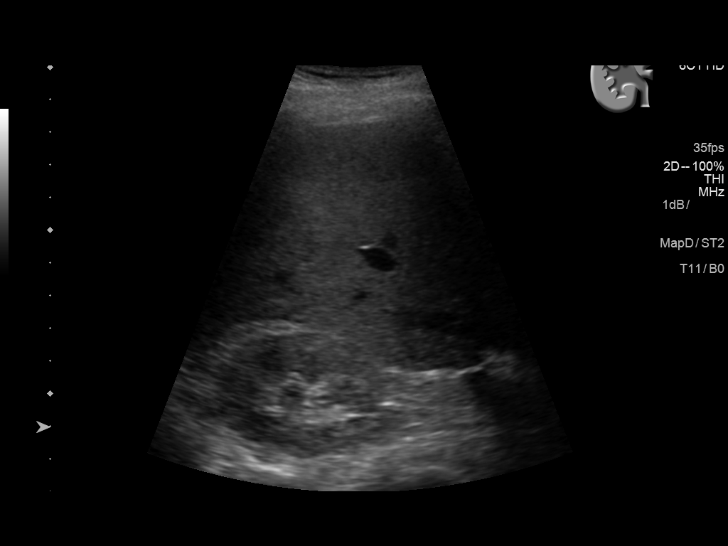
[im 14/40]
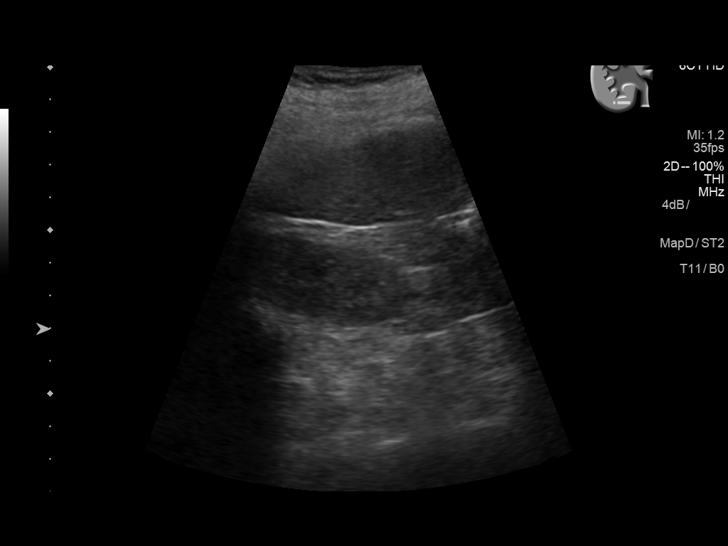
[im 15/40]
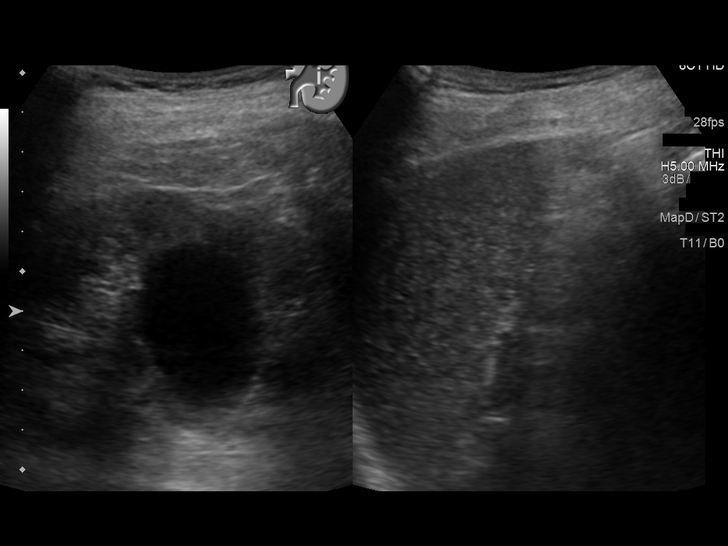
[im 18/40]
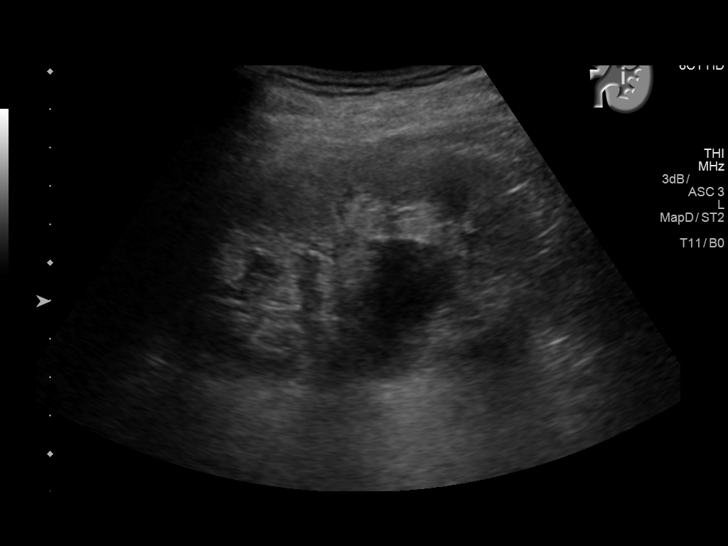
[im 22/40]
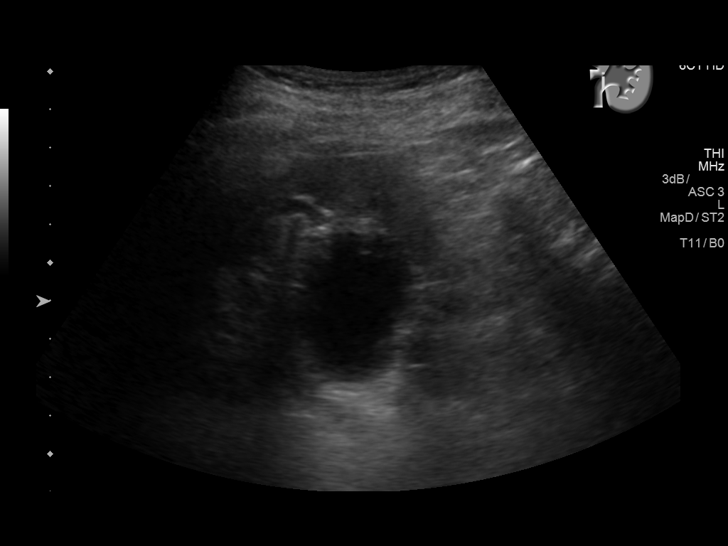
[im 25/40]
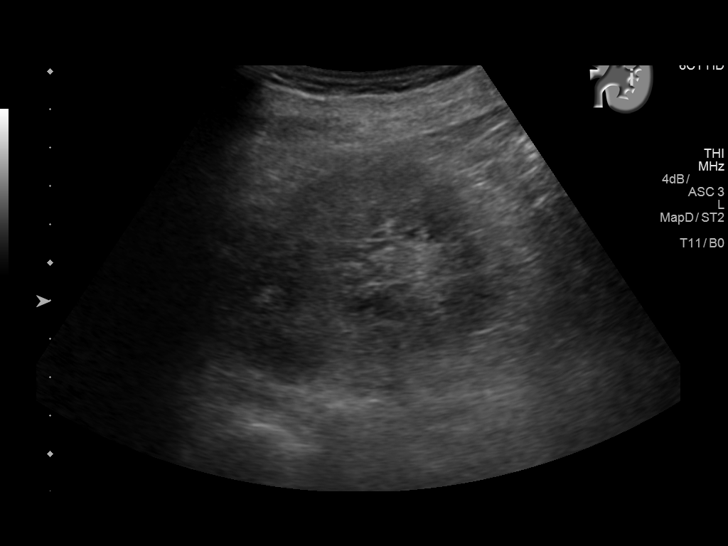
[im 27/40]
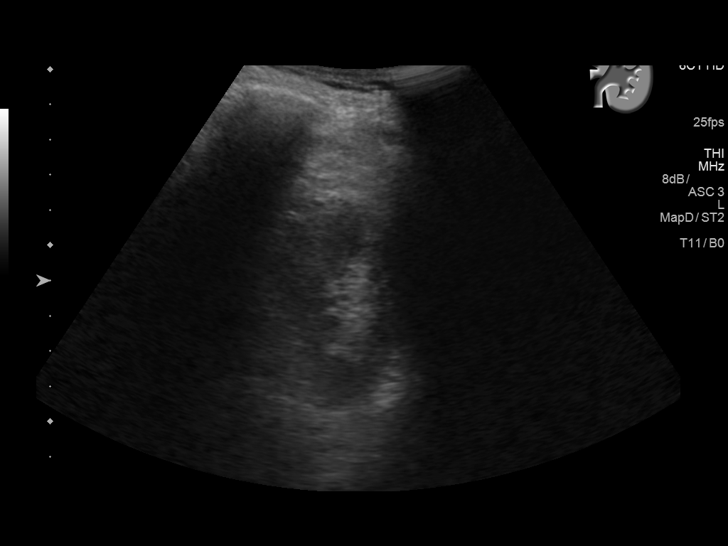
[im 30/40]
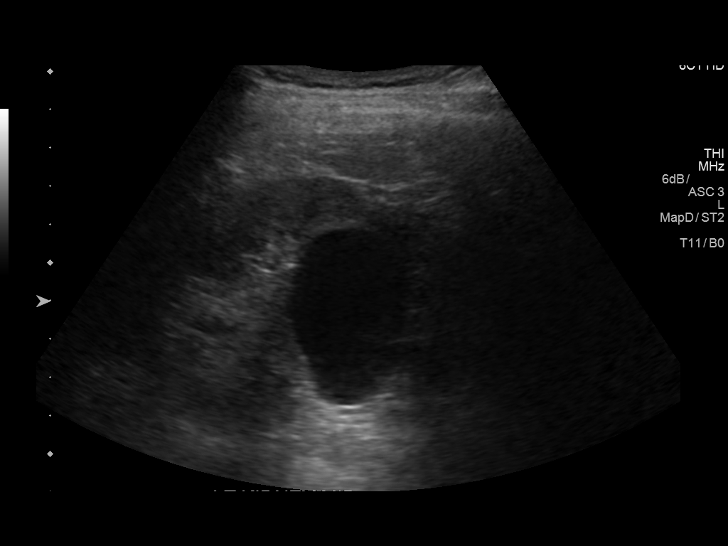
[im 33/40]
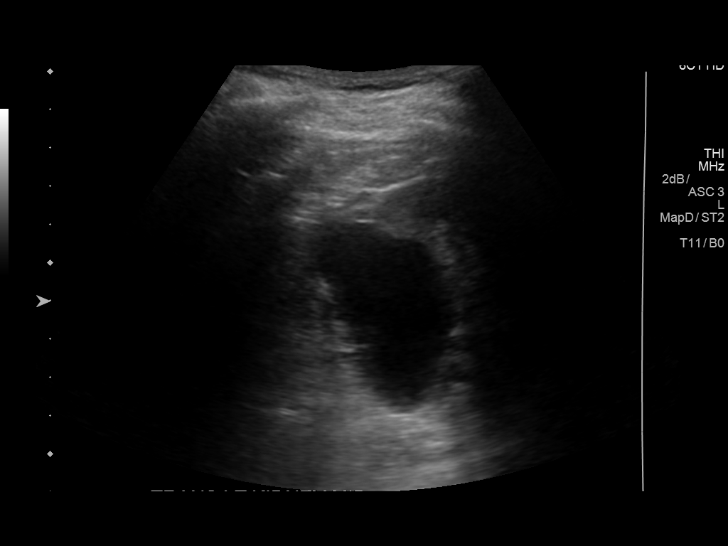
[im 36/40]
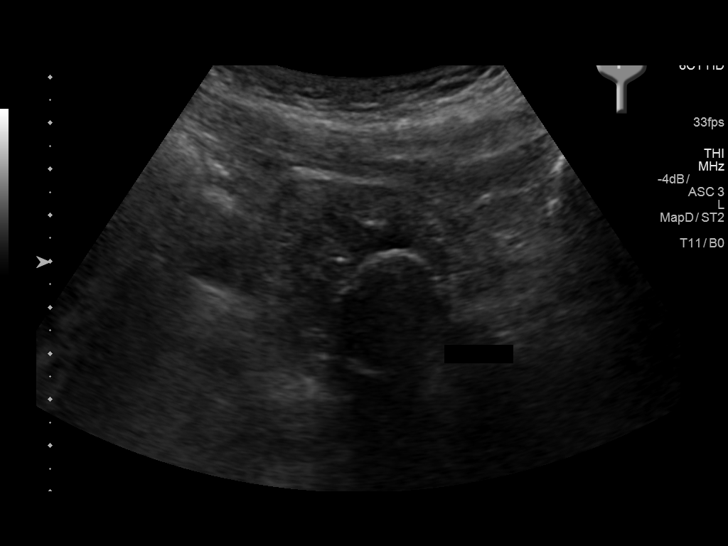
[im 40/40]
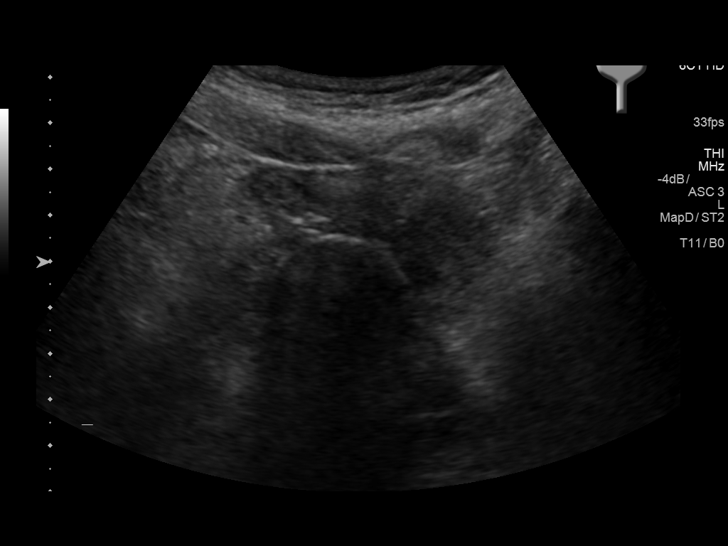

[14 of 25 positions shown; findings below may reference images not displayed]

FINDINGS: Right Kidney:

Length: 10.4 cm. Echogenicity within normal limits. No mass or
hydronephrosis visualized.

Left Kidney:

Length: 10.5 cm. Echogenicity within normal limits. No
hydronephrosis visualized. 4.9 cm simple cyst midportion left
kidney.

Bladder:

Foley is present in the bladder. Bladder is nondistended. Bladder
wall is thickened. This could be the from lack of distention however
bladder pathology including cystitis cannot be excluded.
IMPRESSION: 1. Renal echotexture normal. No hydronephrosis. Simple cysts left
kidney.

2. Foley catheter noted bladder. The bladder is nondistended.
Bladder wall is thickened. This could be from nondistended state.
Active bladder pathology including cystitis cannot be excluded.

## 2018-02-17 ENCOUNTER — Ambulatory Visit
Admission: RE | Admit: 2018-02-17 | Discharge: 2018-02-17 | Disposition: A | Discharge status: Home / Self Care | Payer: Medicare PPO | Source: Ambulatory Visit | Attending: Internal Medicine | Admitting: Internal Medicine

## 2018-02-17 ENCOUNTER — Other Ambulatory Visit: Payer: Self-pay | Admitting: Internal Medicine

## 2018-02-17 DIAGNOSIS — L600 Ingrowing nail: Secondary | ICD-10-CM | POA: Diagnosis not present

## 2018-02-17 DIAGNOSIS — M7122 Synovial cyst of popliteal space [Baker], left knee: Secondary | ICD-10-CM | POA: Diagnosis not present

## 2018-02-17 DIAGNOSIS — G5793 Unspecified mononeuropathy of bilateral lower limbs: Secondary | ICD-10-CM | POA: Insufficient documentation

## 2018-02-17 DIAGNOSIS — I824Z2 Acute embolism and thrombosis of unspecified deep veins of left distal lower extremity: Secondary | ICD-10-CM | POA: Insufficient documentation

## 2018-02-17 DIAGNOSIS — R600 Localized edema: Secondary | ICD-10-CM

## 2018-02-17 DIAGNOSIS — M7121 Synovial cyst of popliteal space [Baker], right knee: Secondary | ICD-10-CM | POA: Diagnosis not present

## 2018-04-06 ENCOUNTER — Emergency Department
Admission: EM | Admit: 2018-04-06 | Discharge: 2018-04-06 | Disposition: A | Discharge status: Home / Self Care | Payer: Medicare PPO | Attending: Emergency Medicine | Admitting: Emergency Medicine

## 2018-04-06 ENCOUNTER — Encounter: Payer: Self-pay | Admitting: Emergency Medicine

## 2018-04-06 ENCOUNTER — Emergency Department: Payer: Medicare PPO

## 2018-04-06 DIAGNOSIS — I10 Essential (primary) hypertension: Secondary | ICD-10-CM | POA: Diagnosis not present

## 2018-04-06 DIAGNOSIS — R001 Bradycardia, unspecified: Secondary | ICD-10-CM | POA: Diagnosis not present

## 2018-04-06 DIAGNOSIS — R531 Weakness: Secondary | ICD-10-CM | POA: Diagnosis not present

## 2018-04-06 DIAGNOSIS — M79604 Pain in right leg: Secondary | ICD-10-CM | POA: Diagnosis not present

## 2018-04-06 LAB — CBC WITH DIFFERENTIAL/PLATELET
BASOS ABS: 0 10*3/uL (ref 0–0.1)
Basophils Relative: 1 %
EOS ABS: 0.1 10*3/uL (ref 0–0.7)
EOS PCT: 3 %
HCT: 34.2 % — ABNORMAL LOW (ref 40.0–52.0)
HEMOGLOBIN: 10.9 g/dL — AB (ref 13.0–18.0)
LYMPHS ABS: 1.2 10*3/uL (ref 1.0–3.6)
Lymphocytes Relative: 25 %
MCH: 23.7 pg — AB (ref 26.0–34.0)
MCHC: 31.8 g/dL — ABNORMAL LOW (ref 32.0–36.0)
MCV: 74.3 fL — ABNORMAL LOW (ref 80.0–100.0)
Monocytes Absolute: 0.4 10*3/uL (ref 0.2–1.0)
Monocytes Relative: 9 %
NEUTROS PCT: 62 %
Neutro Abs: 3 10*3/uL (ref 1.4–6.5)
PLATELETS: 154 10*3/uL (ref 150–440)
RBC: 4.6 MIL/uL (ref 4.40–5.90)
RDW: 15.3 % — ABNORMAL HIGH (ref 11.5–14.5)
WBC: 4.8 10*3/uL (ref 3.8–10.6)

## 2018-04-06 LAB — URINALYSIS, COMPLETE (UACMP) WITH MICROSCOPIC
BACTERIA UA: NONE SEEN
BILIRUBIN URINE: NEGATIVE
GLUCOSE, UA: NEGATIVE mg/dL
Ketones, ur: NEGATIVE mg/dL
Nitrite: NEGATIVE
Protein, ur: NEGATIVE mg/dL
RBC / HPF: NONE SEEN RBC/hpf (ref 0–5)
SPECIFIC GRAVITY, URINE: 1.025 (ref 1.005–1.030)
WBC UA: NONE SEEN WBC/hpf (ref 0–5)
pH: 5.5 (ref 5.0–8.0)

## 2018-04-06 LAB — COMPREHENSIVE METABOLIC PANEL
ALBUMIN: 3.9 g/dL (ref 3.5–5.0)
ALK PHOS: 62 U/L (ref 38–126)
ALT: 12 U/L (ref 0–44)
AST: 20 U/L (ref 15–41)
Anion gap: 7 (ref 5–15)
BUN: 33 mg/dL — AB (ref 8–23)
CALCIUM: 9 mg/dL (ref 8.9–10.3)
CHLORIDE: 102 mmol/L (ref 98–111)
CO2: 31 mmol/L (ref 22–32)
CREATININE: 1.72 mg/dL — AB (ref 0.61–1.24)
GFR calc non Af Amer: 36 mL/min — ABNORMAL LOW (ref >60)
GFR, EST AFRICAN AMERICAN: 41 mL/min — AB (ref >60)
GLUCOSE: 123 mg/dL — AB (ref 70–99)
Potassium: 3.8 mmol/L (ref 3.5–5.1)
SODIUM: 140 mmol/L (ref 135–145)
Total Bilirubin: 0.6 mg/dL (ref 0.3–1.2)
Total Protein: 7.8 g/dL (ref 6.5–8.1)

## 2018-04-06 LAB — TROPONIN I: Troponin I: <0.03 ng/mL (ref <0.03)

## 2018-04-06 MED ORDER — TRAMADOL HCL 50 MG PO TABS: 50.0000 mg | ORAL_TABLET | Freq: Four times a day (QID) | ORAL | 0 refills | Status: DC | PRN

## 2018-04-06 MED ORDER — SODIUM CHLORIDE 0.9 % IV SOLN
Freq: Once | INTRAVENOUS | Status: AC
  Administered 2018-04-06: 11:00:00 via INTRAVENOUS

## 2018-04-06 MED ORDER — TRAMADOL HCL 50 MG PO TABS
50.0000 mg | ORAL_TABLET | Freq: Once | ORAL | Status: AC
  Administered 2018-04-06: 50 mg via ORAL
  Filled 2018-04-06: qty 1

## 2018-04-06 NOTE — ED Provider Notes (Signed)
[facility] Emergency Department Provider Note       Time seen: ----------------------------------------- 10:14 AM on 04/06/2018 -----------------------------------------   I have reviewed the triage vital signs and the nursing notes.  HISTORY   Chief Complaint Knee Pain and Weakness    HPI Elijah Dominguez is a 82 y.o. male with a history of arthritis, hypertension, neuropathy, chronic kidney disease, UTI who presents to the ED for generalized weakness that has been ongoing.  Patient reports he is having right knee and right leg pain that is been present for months.  According to family he has been diagnosed with a blood clot and is currently taking blood thinners.  He is also complaining of generalized weakness, he denies chest pain, difficulty breathing or other complaints.  Past Medical History:  Diagnosis Date  . Arthritis   . Hypertension   . Neuropathy     Patient Active Problem List   Diagnosis Date Noted  . Acute on chronic renal failure (HCC) 10/01/2016  . Urinary tract infection 10/01/2016  . Acute urinary retention 10/01/2016  . Generalized weakness 10/01/2016  . Leukocytosis 10/01/2016  . Diarrhea 10/01/2016  . Left shoulder pain 10/01/2016    Past Surgical History:  Procedure Laterality Date  . THULIUM LASER TURP (TRANSURETHRAL RESECTION OF PROSTATE) N/A 11/30/2016   Procedure: THULIUM LASER TURP (TRANSURETHRAL RESECTION OF PROSTATE);  Surgeon: Vanna ScotlandAshley Brandon, MD;  Location: [facility] ORS;  Service: Urology;  Laterality: N/A;    Allergies Patient has no known allergies.  Social History Social History   Tobacco Use  . Smoking status: Never Smoker  . Smokeless tobacco: Never Used  Substance Use Topics  . Alcohol use: No  . Drug use: No   Review of Systems Constitutional: Negative for fever. Cardiovascular: Negative for chest pain. Respiratory: Negative for shortness of breath. Gastrointestinal: Negative for abdominal pain,  vomiting and diarrhea. Musculoskeletal: Positive for right leg pain Skin: Negative for rash. Neurological: Positive for generalized weakness  All systems negative/normal/unremarkable except as stated in the HPI  ____________________________________________   PHYSICAL EXAM:  VITAL SIGNS: ED Triage Vitals [04/06/18 0954]  Enc Vitals Group     BP (!) 154/57     Pulse Rate (!) 46     Resp 18     Temp 98.2 F (36.8 C)     Temp Source Oral     SpO2 100 %     Weight 160 lb (72.6 kg)     Height 6\' 3"  (1.905 m)     Head Circumference      Peak Flow      Pain Score 8     Pain Loc      Pain Edu?      Excl. in GC?    Constitutional: Alert and oriented.  Chronically ill-appearing, no distress Eyes: Conjunctivae are normal. Normal extraocular movements. ENT   Head: Normocephalic and atraumatic.   Nose: No congestion/rhinnorhea.   Mouth/Throat: Mucous membranes are moist.   Neck: No stridor. Cardiovascular: Normal rate, regular rhythm. No murmurs, rubs, or gallops. Respiratory: Normal respiratory effort without tachypnea nor retractions. Breath sounds are clear and equal bilaterally. No wheezes/rales/rhonchi. Gastrointestinal: Soft and nontender. Normal bowel sounds Musculoskeletal: Nontender with normal range of motion in extremities.  Mild right leg tenderness with mild edema Neurologic:  Normal speech and language. No gross focal neurologic deficits are appreciated.  Generalized weakness, nothing focal Skin:  Skin is warm, dry and intact. No rash noted. Psychiatric: Mood and affect are normal. Speech  and behavior are normal.  ____________________________________________  EKG: Interpreted by me.  Sinus bradycardia with first-degree AV block, LVH with QRS repolarization abnormality, normal QT  Repeat EKG interpreted by me, sinus bradycardia with a rate of 44 bpm, wide QRS, left bundle branch block, normal QT ____________________________________________  ED  COURSE:  As part of my medical decision making, I reviewed the following data within the electronic MEDICAL RECORD NUMBER History obtained from family if available, nursing notes, old chart and ekg, as well as notes from prior ED visits. Patient presented for weakness, we will assess with labs and imaging as indicated at this time.   Procedures ____________________________________________   LABS (pertinent positives/negatives)  Labs Reviewed  CBC WITH DIFFERENTIAL/PLATELET - Abnormal; Notable for the following components:      Result Value   Hemoglobin 10.9 (*)    HCT 34.2 (*)    MCV 74.3 (*)    MCH 23.7 (*)    MCHC 31.8 (*)    RDW 15.3 (*)    All other components within normal limits  COMPREHENSIVE METABOLIC PANEL - Abnormal; Notable for the following components:   Glucose, Bld 123 (*)    BUN 33 (*)    Creatinine, Ser 1.72 (*)    GFR calc non Af Amer 36 (*)    GFR calc Af Amer 41 (*)    All other components within normal limits  URINALYSIS, COMPLETE (UACMP) WITH MICROSCOPIC - Abnormal; Notable for the following components:   Hgb urine dipstick TRACE (*)    Leukocytes, UA TRACE (*)    All other components within normal limits  TROPONIN I  CBG MONITORING, ED    RADIOLOGY Images were viewed by me  CT head, chest x-ray IMPRESSION: No active cardiopulmonary disease. IMPRESSION: No acute intracranial abnormality.  Atrophy, chronic microvascular disease.  ____________________________________________  DIFFERENTIAL DIAGNOSIS   Dehydration, electrolyte abnormality, occult infection, CVA, MI  FINAL ASSESSMENT AND PLAN  Weakness, bradycardia   Plan: The patient had presented for generalized weakness. Patient's labs did not reveal any acute process, although he was given IV fluids. Patient's imaging was negative.  He was bradycardic here with a heart rate in the 30s consistently.  He appears to be in either sinus bradycardia or junctional bradycardia.  He has had a history  of this although this is slower than his normal.  I discussed with cardiology and he will be seen in the office tomorrow.  I will prescribe pain medicine for his right leg.   Ulice Dash, MD   Note: This note was generated in part or whole with voice recognition software. Voice recognition is usually quite accurate but there are transcription errors that can and very often do occur. I apologize for any typographical errors that were not detected and corrected.     Emily Filbert, MD 04/06/18 1409

## 2018-04-06 NOTE — ED Triage Notes (Signed)
Pt reports pain to his right knee for awhile getting worse over the last few days. Pt does have a hx of  A blood clot in his leg. Pt also reports increased weakness over the last week as well and difficult walking.

## 2018-04-06 NOTE — ED Notes (Signed)
Patient transported to X-ray 

## 2018-04-06 NOTE — ED Notes (Signed)
Pt reports that he is having right leg/knee pain that has been present for months - his family reports that he had a "blood clot" and was given 21 days of blood thinner which he has finished - He is also c/o generalized weakness for the last few months - pt denies chest pain or shortness of breath - denies any other complaints

## 2018-04-06 NOTE — ED Notes (Signed)
Pt had episode lasting 2 minutes of heart rate of 34-36 Dr Mayford KnifeWilliams notified and VO for repeat EKG given  Pt remains alert and oriented in no acute distress with respirations even and unlabored at this time

## 2018-05-20 ENCOUNTER — Other Ambulatory Visit: Payer: Self-pay | Admitting: Internal Medicine

## 2018-05-20 DIAGNOSIS — F0390 Unspecified dementia without behavioral disturbance: Secondary | ICD-10-CM

## 2018-06-05 ENCOUNTER — Ambulatory Visit
Admission: RE | Admit: 2018-06-05 | Discharge: 2018-06-05 | Disposition: A | Discharge status: Home / Self Care | Payer: Medicare PPO | Source: Ambulatory Visit | Attending: Internal Medicine | Admitting: Internal Medicine

## 2018-06-05 DIAGNOSIS — F0390 Unspecified dementia without behavioral disturbance: Secondary | ICD-10-CM | POA: Insufficient documentation

## 2018-06-05 DIAGNOSIS — I6389 Other cerebral infarction: Secondary | ICD-10-CM | POA: Insufficient documentation

## 2018-06-05 DIAGNOSIS — I6782 Cerebral ischemia: Secondary | ICD-10-CM | POA: Diagnosis not present

## 2018-06-05 MED ORDER — GADOBENATE DIMEGLUMINE 529 MG/ML IV SOLN
15.0000 mL | Freq: Once | INTRAVENOUS | Status: AC | PRN
  Administered 2018-06-05: 15 mL via INTRAVENOUS

## 2019-02-02 DIAGNOSIS — Z8673 Personal history of transient ischemic attack (TIA), and cerebral infarction without residual deficits: Secondary | ICD-10-CM | POA: Insufficient documentation

## 2019-03-19 ENCOUNTER — Emergency Department: Payer: MEDICARE

## 2019-03-19 ENCOUNTER — Inpatient Hospital Stay
Admission: EM | Admit: 2019-03-19 | Discharge: 2019-03-21 | DRG: 690 | Disposition: A | Discharge status: Home Health | Payer: MEDICARE | Attending: Internal Medicine | Admitting: Internal Medicine

## 2019-03-19 ENCOUNTER — Other Ambulatory Visit: Payer: Self-pay

## 2019-03-19 DIAGNOSIS — Z8249 Family history of ischemic heart disease and other diseases of the circulatory system: Secondary | ICD-10-CM

## 2019-03-19 DIAGNOSIS — G9340 Encephalopathy, unspecified: Secondary | ICD-10-CM | POA: Diagnosis not present

## 2019-03-19 DIAGNOSIS — B9620 Unspecified Escherichia coli [E. coli] as the cause of diseases classified elsewhere: Secondary | ICD-10-CM | POA: Diagnosis present

## 2019-03-19 DIAGNOSIS — N183 Chronic kidney disease, stage 3 unspecified: Secondary | ICD-10-CM | POA: Diagnosis present

## 2019-03-19 DIAGNOSIS — G629 Polyneuropathy, unspecified: Secondary | ICD-10-CM | POA: Diagnosis not present

## 2019-03-19 DIAGNOSIS — I129 Hypertensive chronic kidney disease with stage 1 through stage 4 chronic kidney disease, or unspecified chronic kidney disease: Secondary | ICD-10-CM | POA: Diagnosis present

## 2019-03-19 DIAGNOSIS — Z1159 Encounter for screening for other viral diseases: Secondary | ICD-10-CM | POA: Diagnosis not present

## 2019-03-19 DIAGNOSIS — F0390 Unspecified dementia without behavioral disturbance: Secondary | ICD-10-CM | POA: Diagnosis present

## 2019-03-19 DIAGNOSIS — R4182 Altered mental status, unspecified: Secondary | ICD-10-CM

## 2019-03-19 DIAGNOSIS — F05 Delirium due to known physiological condition: Secondary | ICD-10-CM | POA: Diagnosis not present

## 2019-03-19 DIAGNOSIS — N3000 Acute cystitis without hematuria: Secondary | ICD-10-CM | POA: Diagnosis not present

## 2019-03-19 DIAGNOSIS — Z833 Family history of diabetes mellitus: Secondary | ICD-10-CM

## 2019-03-19 DIAGNOSIS — I10 Essential (primary) hypertension: Secondary | ICD-10-CM | POA: Diagnosis present

## 2019-03-19 DIAGNOSIS — N179 Acute kidney failure, unspecified: Secondary | ICD-10-CM | POA: Diagnosis not present

## 2019-03-19 DIAGNOSIS — Z8673 Personal history of transient ischemic attack (TIA), and cerebral infarction without residual deficits: Secondary | ICD-10-CM

## 2019-03-19 DIAGNOSIS — M1990 Unspecified osteoarthritis, unspecified site: Secondary | ICD-10-CM | POA: Diagnosis not present

## 2019-03-19 DIAGNOSIS — N390 Urinary tract infection, site not specified: Secondary | ICD-10-CM | POA: Diagnosis present

## 2019-03-19 LAB — COMPREHENSIVE METABOLIC PANEL
ALT: 10 U/L (ref 0–44)
AST: 16 U/L (ref 15–41)
Albumin: 3.2 g/dL — ABNORMAL LOW (ref 3.5–5.0)
Alkaline Phosphatase: 69 U/L (ref 38–126)
Anion gap: 9 (ref 5–15)
BUN: 40 mg/dL — ABNORMAL HIGH (ref 8–23)
CO2: 27 mmol/L (ref 22–32)
Calcium: 8.7 mg/dL — ABNORMAL LOW (ref 8.9–10.3)
Chloride: 108 mmol/L (ref 98–111)
Creatinine, Ser: 1.47 mg/dL — ABNORMAL HIGH (ref 0.61–1.24)
GFR calc Af Amer: 51 mL/min — ABNORMAL LOW (ref >60)
GFR calc non Af Amer: 44 mL/min — ABNORMAL LOW (ref >60)
Glucose, Bld: 186 mg/dL — ABNORMAL HIGH (ref 70–99)
Potassium: 3.7 mmol/L (ref 3.5–5.1)
Sodium: 144 mmol/L (ref 135–145)
Total Bilirubin: 0.2 mg/dL — ABNORMAL LOW (ref 0.3–1.2)
Total Protein: 6.6 g/dL (ref 6.5–8.1)

## 2019-03-19 LAB — CBC WITH DIFFERENTIAL/PLATELET
Abs Immature Granulocytes: 0.03 10*3/uL (ref 0.00–0.07)
Basophils Absolute: 0 10*3/uL (ref 0.0–0.1)
Basophils Relative: 1 %
Eosinophils Absolute: 0.3 10*3/uL (ref 0.0–0.5)
Eosinophils Relative: 5 %
HCT: 31.2 % — ABNORMAL LOW (ref 39.0–52.0)
Hemoglobin: 9.6 g/dL — ABNORMAL LOW (ref 13.0–17.0)
Immature Granulocytes: 0 %
Lymphocytes Relative: 17 %
Lymphs Abs: 1.2 10*3/uL (ref 0.7–4.0)
MCH: 23.1 pg — ABNORMAL LOW (ref 26.0–34.0)
MCHC: 30.8 g/dL (ref 30.0–36.0)
MCV: 75.2 fL — ABNORMAL LOW (ref 80.0–100.0)
Monocytes Absolute: 0.7 10*3/uL (ref 0.1–1.0)
Monocytes Relative: 10 %
Neutro Abs: 4.8 10*3/uL (ref 1.7–7.7)
Neutrophils Relative %: 67 %
Platelets: 232 10*3/uL (ref 150–400)
RBC: 4.15 MIL/uL — ABNORMAL LOW (ref 4.22–5.81)
RDW: 14.6 % (ref 11.5–15.5)
WBC: 7.2 10*3/uL (ref 4.0–10.5)
nRBC: 0 % (ref 0.0–0.2)

## 2019-03-19 LAB — URINALYSIS, COMPLETE (UACMP) WITH MICROSCOPIC
Bilirubin Urine: NEGATIVE
Glucose, UA: NEGATIVE mg/dL
Ketones, ur: NEGATIVE mg/dL
Nitrite: POSITIVE — AB
Protein, ur: NEGATIVE mg/dL
Specific Gravity, Urine: 1.015 (ref 1.005–1.030)
WBC, UA: >50 WBC/hpf — ABNORMAL HIGH (ref 0–5)
pH: 5 (ref 5.0–8.0)

## 2019-03-19 LAB — TROPONIN I (HIGH SENSITIVITY): Troponin I (High Sensitivity): 13 ng/L (ref <18)

## 2019-03-19 LAB — LIPASE, BLOOD: Lipase: 41 U/L (ref 11–51)

## 2019-03-19 MED ORDER — SODIUM CHLORIDE 0.9 % IV SOLN
1.0000 g | Freq: Once | INTRAVENOUS | Status: AC
  Administered 2019-03-19: 1 g via INTRAVENOUS
  Filled 2019-03-19: qty 10

## 2019-03-19 NOTE — ED Notes (Signed)
Pt assisted to try and use bedside toilet. Pt unable to sit up properly in bed ith this RN's help d/t weakness.

## 2019-03-19 NOTE — ED Triage Notes (Signed)
Pt arrives via [facility] for family reports of altered mental status x 1 day. Pt has hx of TIA and daughter was concerned. Pt states "I just feel sick but I feel better now since I got here". Pt has hx of dementia and is a poor historian. Pt arrives with 20 gauge in the right forearm. Pt reports no pain.

## 2019-03-19 NOTE — ED Notes (Signed)
Ok to d/c repeat trop per Dr. Cherylann Banas

## 2019-03-19 NOTE — ED Notes (Signed)
Pt assisted onto bedpan per request as he feels he needs to have a BM. Pt repositioned in bed.

## 2019-03-19 NOTE — ED Notes (Signed)
PT's daughter Linus Orn updated on pt's status

## 2019-03-19 NOTE — H&P (Signed)
[facility] -  at [facility]   PATIENT NAME: Elijah Dominguez    MR#:  [id number]  DATE OF BIRTH:  [birth year]  DATE OF ADMISSION:  03/19/2019  PRIMARY CARE PHYSICIAN: Barbette ReichmannHande, Vishwanath, MD   REQUESTING/REFERRING PHYSICIAN: Marisa SeverinSiadecki, MD  CHIEF COMPLAINT:   Chief Complaint  Patient presents with  . Altered Mental Status    HISTORY OF PRESENT ILLNESS:  Elijah Dominguez  is a 83 y.o. male who presents with chief complaint as above.  Patient presents the ED with a complaint of abdominal pain with nausea as well as increased confusion and weakness over the past couple of days.  His abdominal pain and nausea have now resolved.  However, he was found to have a UTI in the ED.  Hospitalist were called for admission  PAST MEDICAL HISTORY:   Past Medical History:  Diagnosis Date  . Arthritis   . Hypertension   . Neuropathy      PAST SURGICAL HISTORY:   Past Surgical History:  Procedure Laterality Date  . THULIUM LASER TURP (TRANSURETHRAL RESECTION OF PROSTATE) N/A 11/30/2016   Procedure: THULIUM LASER TURP (TRANSURETHRAL RESECTION OF PROSTATE);  Surgeon: Vanna ScotlandAshley Brandon, MD;  Location: [facility] ORS;  Service: Urology;  Laterality: N/A;     SOCIAL HISTORY:   Social History   Tobacco Use  . Smoking status: Never Smoker  . Smokeless tobacco: Never Used  Substance Use Topics  . Alcohol use: No     FAMILY HISTORY:   Family History  Problem Relation Age of Onset  . Diabetes Mother   . Heart attack Father   . Prostate cancer Neg Hx   . Bladder Cancer Neg Hx   . Kidney cancer Neg Hx      DRUG ALLERGIES:  No Known Allergies  MEDICATIONS AT HOME:   Prior to Admission medications   Medication Sig Start Date End Date Taking? Authorizing Provider  gabapentin (NEURONTIN) 300 MG capsule Take 300 mg by mouth 3 (three) times daily.   Yes [provider]  lisinopril-hydrochlorothiazide (PRINZIDE,ZESTORETIC) 10-12.5 MG tablet Take 1 tablet by mouth  daily. 02/17/18  Yes [provider]  memantine (NAMENDA) 5 MG tablet Take 5 mg by mouth 2 (two) times daily. 12/27/18  Yes [provider]  mirtazapine (REMERON) 7.5 MG tablet Take 7.5 mg by mouth every evening. 03/07/19  Yes [provider]  vitamin B-12 (CYANOCOBALAMIN) 1000 MCG tablet Take 1,000 mcg by mouth daily. 12/30/18  Yes [provider]    REVIEW OF SYSTEMS:  Review of Systems  Constitutional: Positive for malaise/fatigue. Negative for chills, fever and weight loss.  HENT: Negative for ear pain, hearing loss and tinnitus.   Eyes: Negative for blurred vision, double vision, pain and redness.  Respiratory: Negative for cough, hemoptysis and shortness of breath.   Cardiovascular: Negative for chest pain, palpitations, orthopnea and leg swelling.  Gastrointestinal: Positive for abdominal pain and nausea. Negative for constipation, diarrhea and vomiting.  Genitourinary: Negative for dysuria, frequency and hematuria.  Musculoskeletal: Negative for back pain, joint pain and neck pain.  Skin:       No acne, rash, or lesions  Neurological: Negative for dizziness, tremors, focal weakness and weakness.  Endo/Heme/Allergies: Negative for polydipsia. Does not bruise/bleed easily.  Psychiatric/Behavioral: Negative for depression. The patient is not nervous/anxious and does not have insomnia.      VITAL SIGNS:   Vitals:   03/19/19 2302 03/19/19 2303 03/19/19 2315 03/19/19 2328  BP:  Pulse:  65 61 60  Resp: 20  18 16   Temp:      TempSrc:      SpO2:  100% 100% 100%   Wt Readings from Last 3 Encounters:  06/05/18 72.6 kg  04/06/18 72.6 kg  09/21/17 77.1 kg    PHYSICAL EXAMINATION:  Physical Exam  Vitals reviewed. Constitutional: He appears well-developed and well-nourished. No distress.  HENT:  Head: Normocephalic and atraumatic.  Mouth/Throat: Oropharynx is clear and moist.  Eyes: Pupils are equal, round, and reactive to light. Conjunctivae  and EOM are normal. No scleral icterus.  Neck: Normal range of motion. Neck supple. No JVD present. No thyromegaly present.  Cardiovascular: Normal rate, regular rhythm and intact distal pulses. Exam reveals no gallop and no friction rub.  No murmur heard. Respiratory: Effort normal and breath sounds normal. No respiratory distress. He has no wheezes. He has no rales.  GI: Soft. Bowel sounds are normal. He exhibits no distension. There is abdominal tenderness.  Musculoskeletal: Normal range of motion.        General: No edema.     Comments: No arthritis, no gout  Lymphadenopathy:    He has no cervical adenopathy.  Neurological: He is alert. No cranial nerve deficit.  Unable to fully assess due to patient condition  Skin: Skin is warm and dry. No rash noted. No erythema.  Psychiatric:  Unable to fully assess due to patient condition    LABORATORY PANEL:   CBC Recent Labs  Lab 03/19/19 1858  WBC 7.2  HGB 9.6*  HCT 31.2*  PLT 232   ------------------------------------------------------------------------------------------------------------------  Chemistries  Recent Labs  Lab 03/19/19 1858  NA 144  K 3.7  CL 108  CO2 27  GLUCOSE 186*  BUN 40*  CREATININE 1.47*  CALCIUM 8.7*  AST 16  ALT 10  ALKPHOS 69  BILITOT 0.2*   ------------------------------------------------------------------------------------------------------------------  Cardiac Enzymes No results for input(s): TROPONINI in the last 168 hours. ------------------------------------------------------------------------------------------------------------------  RADIOLOGY:  Ct Head Wo Contrast  Result Date: 03/19/2019 CLINICAL DATA:  Altered mental status for 1 day. History of dementia. EXAM: CT HEAD WITHOUT CONTRAST TECHNIQUE: Contiguous axial images were obtained from the base of the skull through the vertex without intravenous contrast. COMPARISON:  Brain MRI 06/05/2018 FINDINGS: Brain: There is no  evidence of acute large territory infarct, intracranial hemorrhage, mass, midline shift, or extra-axial fluid collection. Chronic lacunar infarcts are again seen in the left cerebellum, bilateral basal ganglia, thalami, and cerebral white matter. Additional confluent cerebral white matter hypodensities bilaterally are nonspecific but compatible with severe chronic small vessel ischemic disease. There is moderate cerebral atrophy. Vascular: Calcified atherosclerosis at the skull base. No hyperdense vessel. Skull: No fracture or focal osseous lesion. Sinuses/Orbits: Visualized paranasal sinuses and mastoid air cells are clear. Orbits are unremarkable. Other: None. IMPRESSION: 1. No evidence of acute intracranial abnormality. 2. Severe chronic small vessel ischemic disease. Electronically Signed   By: Sebastian AcheAllen  Grady M.D.   On: 03/19/2019 22:17    EKG:   Orders placed or performed during the hospital encounter of 03/19/19  . EKG 12-Lead  . EKG 12-Lead    IMPRESSION AND PLAN:  Principal Problem:   Urinary tract infection -urine culture sent, IV antibiotics given Active Problems:   Acute encephalopathy -due to UTI as above, monitor for improvement as infection is treated   HTN (hypertension) -home dose antihypertensives   CKD (chronic kidney disease), stage III (HCC) -at baseline, avoid nephrotoxins and monitor  Chart review performed and  case discussed with ED provider. Labs, imaging and/or ECG reviewed by provider and discussed with patient/family. Management plans discussed with the patient and/or family.  COVID-19 status: Test pending  DVT PROPHYLAXIS: SubQ lovenox   GI PROPHYLAXIS:  None  ADMISSION STATUS: Inpatient     CODE STATUS: Full Code Status History    Date Active Date Inactive Code Status Order ID Comments User Context   10/01/2016 1924 10/03/2016 1808 Full Code [id number]  Theodoro Grist, MD Inpatient   Advance Care Planning Activity      TOTAL TIME TAKING CARE OF THIS  PATIENT: 45 minutes.   This patient was evaluated in the context of the global COVID-19 pandemic, which necessitated consideration that the patient might be at risk for infection with the SARS-CoV-2 virus that causes COVID-19. Institutional protocols and algorithms that pertain to the evaluation of patients at risk for COVID-19 are in a state of rapid change based on information released by regulatory bodies including the CDC and federal and state organizations. These policies and algorithms were followed to the best of this provider's knowledge to date during the patient's care at this facility.  Ethlyn Daniels 03/19/2019, 11:48 PM  [facility]  Office  [phone]  CC: Primary care physician; Tracie Harrier, MD  Note:  This document was prepared using Dragon voice recognition software and may include unintentional dictation errors.

## 2019-03-19 NOTE — ED Notes (Signed)
ED TO INPATIENT HANDOFF REPORT  ED Nurse Name and Phone #: Greta DoomGeorgie [phone]  S Name/Age/Gender Elijah SmilesPaul D Dominguez 83 y.o. male Room/Bed: ED18A/ED18A  Code Status   Code Status: Prior  Home/SNF/Other Home Patient oriented to: self, place and situation Is this baseline? No   Triage Complete: Triage complete  Chief Complaint Generalized Weakness  Triage Note Pt arrives via [facility] for family reports of altered mental status x 1 day. Pt has hx of TIA and daughter was concerned. Pt states "I just feel sick but I feel better now since I got here". Pt has hx of dementia and is a poor historian. Pt arrives with 20 gauge in the right forearm. Pt reports no pain.    Allergies No Known Allergies  Level of Care/Admitting Diagnosis ED Disposition    ED Disposition Condition Comment   Admit  The patient appears reasonably stabilized for admission considering the current resources, flow, and capabilities available in the ED at this time, and I doubt any other [facility] requiring further screening and/or treatment in the ED prior to admission is  present.       B Medical/Surgery History Past Medical History:  Diagnosis Date  . Arthritis   . Hypertension   . Neuropathy    Past Surgical History:  Procedure Laterality Date  . THULIUM LASER TURP (TRANSURETHRAL RESECTION OF PROSTATE) N/A 11/30/2016   Procedure: THULIUM LASER TURP (TRANSURETHRAL RESECTION OF PROSTATE);  Surgeon: Vanna ScotlandAshley Brandon, MD;  Location: [facility] ORS;  Service: Urology;  Laterality: N/A;     A IV Location/Drains/Wounds Patient Lines/Drains/Airways Status   Active Line/Drains/Airways    Name:   Placement date:   Placement time:   Site:   Days:   Peripheral IV 03/19/19 Right Arm   03/19/19    1852    Arm   less than 1   Incision (Closed) 11/30/16 Perineum Other (Comment)   11/30/16    0821     839          Intake/Output Last 24 hours No intake or output data in the 24 hours ending 03/19/19 2308  Labs/Imaging Results for  orders placed or performed during the hospital encounter of 03/19/19 (from the past 48 hour(s))  Comprehensive metabolic panel     Status: Abnormal   Collection Time: 03/19/19  6:58 PM  Result Value Ref Range   Sodium 144 135 - 145 mmol/L   Potassium 3.7 3.5 - 5.1 mmol/L   Chloride 108 98 - 111 mmol/L   CO2 27 22 - 32 mmol/L   Glucose, Bld 186 (H) 70 - 99 mg/dL   BUN 40 (H) 8 - 23 mg/dL   Creatinine, Ser 4.541.47 (H) 0.61 - 1.24 mg/dL   Calcium 8.7 (L) 8.9 - 10.3 mg/dL   Total Protein 6.6 6.5 - 8.1 g/dL   Albumin 3.2 (L) 3.5 - 5.0 g/dL   AST 16 15 - 41 U/L   ALT 10 0 - 44 U/L   Alkaline Phosphatase 69 38 - 126 U/L   Total Bilirubin 0.2 (L) 0.3 - 1.2 mg/dL   GFR calc non Af Amer 44 (L) >60 mL/min   GFR calc Af Amer 51 (L) >60 mL/min   Anion gap 9 5 - 15    Comment: Performed at [facility], [address] [place], [place] [address]  CBC with Differential     Status: Abnormal   Collection Time: 03/19/19  6:58 PM  Result Value Ref Range   WBC 7.2 4.0 -  10.5 K/uL   RBC 4.15 (L) 4.22 - 5.81 MIL/uL   Hemoglobin 9.6 (L) 13.0 - 17.0 g/dL   HCT 31.2 (L) 39.0 - 52.0 %   MCV 75.2 (L) 80.0 - 100.0 fL   MCH 23.1 (L) 26.0 - 34.0 pg   MCHC 30.8 30.0 - 36.0 g/dL   RDW 14.6 11.5 - 15.5 %   Platelets 232 150 - 400 K/uL   nRBC 0.0 0.0 - 0.2 %   Neutrophils Relative % 67 %   Neutro Abs 4.8 1.7 - 7.7 K/uL   Lymphocytes Relative 17 %   Lymphs Abs 1.2 0.7 - 4.0 K/uL   Monocytes Relative 10 %   Monocytes Absolute 0.7 0.1 - 1.0 K/uL   Eosinophils Relative 5 %   Eosinophils Absolute 0.3 0.0 - 0.5 K/uL   Basophils Relative 1 %   Basophils Absolute 0.0 0.0 - 0.1 K/uL   Immature Granulocytes 0 %   Abs Immature Granulocytes 0.03 0.00 - 0.07 K/uL    Comment: Performed at [facility], [place], [place] [address]  Troponin I (High Sensitivity)     Status: None   Collection Time: 03/19/19  6:58 PM  Result Value Ref Range   Troponin I (High Sensitivity) 13 <18 ng/L     Comment: (NOTE) Elevated high sensitivity troponin I (hsTnI) values and significant  changes across serial measurements may suggest ACS but many other  chronic and acute conditions are known to elevate hsTnI results.  Refer to the "Links" section for chest pain algorithms and additional  guidance. Performed at [facility], [address] [place], [place] [address]   Lipase, blood     Status: None   Collection Time: 03/19/19  6:58 PM  Result Value Ref Range   Lipase 41 11 - 51 U/L    Comment: Performed at [facility], [address] [place], [place] [address]  Urinalysis, Complete w Microscopic     Status: Abnormal   Collection Time: 03/19/19  8:56 PM  Result Value Ref Range   Color, Urine YELLOW (A) YELLOW   APPearance CLOUDY (A) CLEAR   Specific Gravity, Urine 1.015 1.005 - 1.030   pH 5.0 5.0 - 8.0   Glucose, UA NEGATIVE NEGATIVE mg/dL   Hgb urine dipstick SMALL (A) NEGATIVE   Bilirubin Urine NEGATIVE NEGATIVE   Ketones, ur NEGATIVE NEGATIVE mg/dL   Protein, ur NEGATIVE NEGATIVE mg/dL   Nitrite POSITIVE (A) NEGATIVE   Leukocytes,Ua LARGE (A) NEGATIVE   RBC / HPF 6-10 0 - 5 RBC/hpf   WBC, UA >50 (H) 0 - 5 WBC/hpf   Bacteria, UA RARE (A) NONE SEEN   Squamous Epithelial / LPF 6-10 0 - 5   WBC Clumps PRESENT     Comment: Performed at [facility], [address], ,  [address]   Ct Head Wo Contrast  Result Date: 03/19/2019 CLINICAL DATA:  Altered mental status for 1 day. History of dementia. EXAM: CT HEAD WITHOUT CONTRAST TECHNIQUE: Contiguous axial images were obtained from the base of the skull through the vertex without intravenous contrast. COMPARISON:  Brain MRI 06/05/2018 FINDINGS: Brain: There is no evidence of acute large territory infarct, intracranial hemorrhage, mass, midline shift, or extra-axial fluid collection. Chronic lacunar infarcts are again seen in the left cerebellum, bilateral basal ganglia, thalami, and  cerebral white matter. Additional confluent cerebral white matter hypodensities bilaterally are nonspecific but compatible with severe chronic small vessel ischemic disease. There is moderate  cerebral atrophy. Vascular: Calcified atherosclerosis at the skull base. No hyperdense vessel. Skull: No fracture or focal osseous lesion. Sinuses/Orbits: Visualized paranasal sinuses and mastoid air cells are clear. Orbits are unremarkable. Other: None. IMPRESSION: 1. No evidence of acute intracranial abnormality. 2. Severe chronic small vessel ischemic disease. Electronically Signed   By: Sebastian AcheAllen  Grady M.D.   On: 03/19/2019 22:17    Pending Labs Unresulted Labs (From admission, onward)    Start     Ordered   03/19/19 2245  Novel Coronavirus,NAA,(SEND-OUT TO REF LAB - TAT 24-48 hrs); Hosp Order  (Asymptomatic Patients Labs)  Once,   STAT    Question:  Rule Out  Answer:  Yes   03/19/19 2244          Vitals/Pain Today's Vitals   03/19/19 2130 03/19/19 2145 03/19/19 2200 03/19/19 2215  BP: (!) 168/78  (!) 166/68   Pulse: (!) 56 67  61  Resp: 18 18 18 20   Temp:      TempSrc:      SpO2: 100% 100%  100%  PainSc:        Isolation Precautions No active isolations  Medications Medications  cefTRIAXone (ROCEPHIN) 1 g in sodium chloride 0.9 % 100 mL IVPB (1 g Intravenous New Bag/Given 03/19/19 2257)    Mobility walks Moderate fall risk   Focused Assessments Neuro Assessment Handoff:  Swallow screen pass? Yes          Neuro Assessment:   Neuro Checks:      Last Documented NIHSS Modified Score:   Has TPA been given? No If patient is a Neuro Trauma and patient is going to OR before floor call report to 4N Charge nurse: [phone] or [phone]     R Recommendations: See Admitting Provider Note  Report given to:   Additional Notes:  AMS d/t UTI; recevied rocephin IV in ED; 1st Troponin NEG.

## 2019-03-19 NOTE — ED Provider Notes (Signed)
[facility] Emergency Department Provider Note ____________________________________________   First MD Initiated Contact with Patient 03/19/19 1933     (approximate)  I have reviewed the triage vital signs and the nursing notes.   HISTORY  Chief Complaint Altered Mental Status  Level 5 caveat: History of present illness limited due to dementia  HPI Elijah Dominguez is a 83 y.o. male with PMH as noted below as well as a history of multiple "mini strokes" who presents with 2 primary issues.  Specifically today, the patient had some abdominal discomfort earlier which has now resolved.  It was associated with nausea.  The patient states that he feels better.  In addition, over the phone, the daughter reports that several days ago the patient had an episode in which he had weakness and possible facial droop on the right which looked like a stroke.  It resolved after several minutes, however they did not come to the hospital at that time.  She states that the patient has been weaker and more confused than normal over the last few days.  The patient denies any acute pain or other symptoms at this time.   Past Medical History:  Diagnosis Date  . Arthritis   . Hypertension   . Neuropathy     Patient Active Problem List   Diagnosis Date Noted  . Acute on chronic renal failure (HCC) 10/01/2016  . Urinary tract infection 10/01/2016  . Acute urinary retention 10/01/2016  . Generalized weakness 10/01/2016  . Leukocytosis 10/01/2016  . Diarrhea 10/01/2016  . Left shoulder pain 10/01/2016    Past Surgical History:  Procedure Laterality Date  . THULIUM LASER TURP (TRANSURETHRAL RESECTION OF PROSTATE) N/A 11/30/2016   Procedure: THULIUM LASER TURP (TRANSURETHRAL RESECTION OF PROSTATE);  Surgeon: Vanna ScotlandAshley Brandon, MD;  Location: [facility] ORS;  Service: Urology;  Laterality: N/A;    Prior to Admission medications   Medication Sig Start Date End Date Taking? Authorizing  Provider  amLODipine (NORVASC) 5 MG tablet Take 1 tablet (5 mg total) by mouth daily. Patient not taking: Reported on 04/06/2018 10/03/16   Katha HammingKonidena, Snehalatha, MD  finasteride (PROSCAR) 5 MG tablet Take 1 tablet (5 mg total) by mouth daily. Patient not taking: Reported on 04/06/2018 10/04/16   Katha HammingKonidena, Snehalatha, MD  gabapentin (NEURONTIN) 300 MG capsule Take 300 mg by mouth 3 (three) times daily.    [provider]  lisinopril-hydrochlorothiazide (PRINZIDE,ZESTORETIC) 10-12.5 MG tablet Take 1 tablet by mouth daily. 02/17/18   [provider]  Naproxen Sodium (ALEVE) 220 MG CAPS Take 220-440 mg by mouth 3 (three) times daily as needed (for pain.).    [provider]  tamsulosin (FLOMAX) 0.4 MG CAPS capsule Take 1 capsule (0.4 mg total) by mouth daily. Patient not taking: Reported on 04/06/2018 10/04/16   Katha HammingKonidena, Snehalatha, MD  traMADol (ULTRAM) 50 MG tablet Take 1 tablet (50 mg total) by mouth every 6 (six) hours as needed. 04/06/18 04/06/19  Emily FilbertWilliams, Jonathan E, MD  trolamine salicylate (ASPERCREME) 10 % cream Apply topically 2 (two) times daily as needed for muscle pain (Left shoulder). 10/03/16   Katha HammingKonidena, Snehalatha, MD    Allergies Patient has no known allergies.  Family History  Problem Relation Age of Onset  . Diabetes Mother   . Heart attack Father   . Prostate cancer Neg Hx   . Bladder Cancer Neg Hx   . Kidney cancer Neg Hx     Social History Social History   Tobacco Use  .  Smoking status: Never Smoker  . Smokeless tobacco: Never Used  Substance Use Topics  . Alcohol use: No  . Drug use: No    Review of Systems Level 5 caveat: Unable to obtain review of systems due to dementia    ____________________________________________   PHYSICAL EXAM:  VITAL SIGNS: ED Triage Vitals  Enc Vitals Group     BP 03/19/19 1849 (!) 152/65     Pulse Rate 03/19/19 1849 (!) 56     Resp 03/19/19 1845 17     Temp 03/19/19 1849 98.4 F (36.9 C)     Temp  Source 03/19/19 1849 Oral     SpO2 03/19/19 1849 100 %     Weight --      Height --      Head Circumference --      Peak Flow --      Pain Score 03/19/19 1850 0     Pain Loc --      Pain Edu? --      Excl. in [place]? --     Constitutional: Alert, oriented x1.  Comfortable appearing and in no acute distress. Eyes: Conjunctivae are normal.  EOMI.  PERRLA. Head: Atraumatic. Nose: No congestion/rhinnorhea. Mouth/Throat: Mucous membranes are moist.   Neck: Normal range of motion.  Cardiovascular: Normal rate, regular rhythm. Grossly normal heart sounds.  Good peripheral circulation. Respiratory: Normal respiratory effort.  No retractions. Lungs CTAB. Gastrointestinal: Soft and nontender. No distention.  Genitourinary: No flank tenderness. Musculoskeletal: No lower extremity edema.  Extremities warm and well perfused.  Neurologic:  Normal speech and language.  Motor intact in all extremities.  Normal coordination.  No gross focal neurologic deficits are appreciated.  Skin:  Skin is warm and dry. No rash noted. Psychiatric: Calm and cooperative.  ____________________________________________   LABS (all labs ordered are listed, but only abnormal results are displayed)  Labs Reviewed  URINALYSIS, COMPLETE (UACMP) WITH MICROSCOPIC - Abnormal; Notable for the following components:      Result Value   Color, Urine YELLOW (*)    APPearance CLOUDY (*)    Hgb urine dipstick SMALL (*)    Nitrite POSITIVE (*)    Leukocytes,Ua LARGE (*)    WBC, UA >50 (*)    Bacteria, UA RARE (*)    All other components within normal limits  COMPREHENSIVE METABOLIC PANEL - Abnormal; Notable for the following components:   Glucose, Bld 186 (*)    BUN 40 (*)    Creatinine, Ser 1.47 (*)    Calcium 8.7 (*)    Albumin 3.2 (*)    Total Bilirubin 0.2 (*)    GFR calc non Af Amer 44 (*)    GFR calc Af Amer 51 (*)    All other components within normal limits  CBC WITH DIFFERENTIAL/PLATELET - Abnormal; Notable  for the following components:   RBC 4.15 (*)    Hemoglobin 9.6 (*)    HCT 31.2 (*)    MCV 75.2 (*)    MCH 23.1 (*)    All other components within normal limits  NOVEL CORONAVIRUS, NAA (HOSPITAL ORDER, SEND-OUT TO REF LAB)  TROPONIN I (HIGH SENSITIVITY)  LIPASE, BLOOD   ____________________________________________  EKG  ED ECG REPORT I, Arta Silence, the attending physician, personally viewed and interpreted this ECG.  Date: 03/19/2019 EKG Time: 1845 Rate: 57 Rhythm: normal sinus rhythm QRS Axis: normal Intervals: LBBB ST/T Wave abnormalities: normal Narrative Interpretation: no evidence of acute ischemia; no significant change when compared to EKG  of 04/06/2018  ____________________________________________  RADIOLOGY  CT head: No acute abnormality  ____________________________________________   PROCEDURES  Procedure(s) performed: No  Procedures  Critical Care performed: No ____________________________________________   INITIAL IMPRESSION / ASSESSMENT AND PLAN / ED COURSE  Pertinent labs & imaging results that were available during my care of the patient were reviewed by me and considered in my medical decision making (see chart for details).  83 year old male with PMH as noted above presents with an episode of abdominal discomfort earlier today which has resolved as well as possible strokelike symptoms several days ago which resolved after a short amount of time and have not recurred since then.  Per the daughter he has been weaker and more confused the last few days.  I reviewed the past medical records in Epic.  The patient was most recently seen in the ED in July 2019 with generalized weakness and bradycardia and was discharged with outpatient cardiology follow-up.  On exam today the patient is overall relatively comfortable appearing.  His vital signs are normal except for mild tachycardia when he arrived.  Neurologic exam is nonfocal.  The abdomen is  soft and nontender.  Given that the patient's symptoms have resolved, differential diagnosis is unclear.  It is possible that the patient had a TIA a few days ago although he has no ongoing neurologic symptoms and no neurologic abnormality on exam.  We will obtain a CT to further evaluate however if this is negative the patient likely will not need further emergent work-up.  There is no evidence of acute intra-abdominal process.  We will obtain basic labs and a UA to evaluate for etiologies of possible intermittent abdominal discomfort and generalized weakness.  ----------------------------------------- 10:43 PM on 03/19/2019 -----------------------------------------  UA is consistent with UTI.  The CT head is negative and the remainder of the lab work-up is unremarkable.  On further discussion with the patient's daughter, she reports that the patient has been significantly weak and confused when compared to his baseline, so we will admit him for treatment of the UTI.  I signed the patient out to the hospitalist Dr. Anne HahnWillis.  _________________________  Elijah Dominguez was evaluated in Emergency Department on 03/19/2019 for the symptoms described in the history of present illness. He was evaluated in the context of the global COVID-19 pandemic, which necessitated consideration that the patient might be at risk for infection with the SARS-CoV-2 virus that causes COVID-19. Institutional protocols and algorithms that pertain to the evaluation of patients at risk for COVID-19 are in a state of rapid change based on information released by regulatory bodies including the CDC and federal and state organizations. These policies and algorithms were followed during the patient's care in the ED. ____________________________________________   FINAL CLINICAL IMPRESSION(S) / ED DIAGNOSES  Final diagnoses:  Urinary tract infection without hematuria, site unspecified  Altered mental status, unspecified altered  mental status type      NEW MEDICATIONS STARTED DURING THIS VISIT:  New Prescriptions   No medications on file     Note:  This document was prepared using Dragon voice recognition software and may include unintentional dictation errors.    Dionne BucySiadecki, Dotsie Gillette, MD 03/19/19 2245

## 2019-03-19 NOTE — ED Notes (Signed)
Pt given ginger ale and warm blanket.

## 2019-03-19 NOTE — ED Notes (Signed)
Lav, lt green, blue, grey top tubes sent to lab

## 2019-03-20 ENCOUNTER — Other Ambulatory Visit: Payer: Self-pay

## 2019-03-20 DIAGNOSIS — R4182 Altered mental status, unspecified: Secondary | ICD-10-CM | POA: Diagnosis not present

## 2019-03-20 DIAGNOSIS — N3000 Acute cystitis without hematuria: Secondary | ICD-10-CM | POA: Diagnosis not present

## 2019-03-20 LAB — CBC
HCT: 33.4 % — ABNORMAL LOW (ref 39.0–52.0)
Hemoglobin: 10.3 g/dL — ABNORMAL LOW (ref 13.0–17.0)
MCH: 23.3 pg — ABNORMAL LOW (ref 26.0–34.0)
MCHC: 30.8 g/dL (ref 30.0–36.0)
MCV: 75.6 fL — ABNORMAL LOW (ref 80.0–100.0)
Platelets: 223 10*3/uL (ref 150–400)
RBC: 4.42 MIL/uL (ref 4.22–5.81)
RDW: 14.5 % (ref 11.5–15.5)
WBC: 7.3 10*3/uL (ref 4.0–10.5)
nRBC: 0 % (ref 0.0–0.2)

## 2019-03-20 LAB — CREATININE, SERUM
Creatinine, Ser: 1.24 mg/dL (ref 0.61–1.24)
GFR calc Af Amer: >60 mL/min (ref >60)
GFR calc non Af Amer: 54 mL/min — ABNORMAL LOW (ref >60)

## 2019-03-20 MED ORDER — ONDANSETRON HCL 4 MG PO TABS: 4.0000 mg | ORAL_TABLET | Freq: Four times a day (QID) | ORAL | Status: DC | PRN

## 2019-03-20 MED ORDER — HEPARIN SODIUM (PORCINE) 5000 UNIT/ML IJ SOLN
5000.0000 [IU] | Freq: Three times a day (TID) | INTRAMUSCULAR | Status: DC
  Administered 2019-03-20 – 2019-03-21 (×4): 5000 [IU] via SUBCUTANEOUS
  Filled 2019-03-20 (×4): qty 1

## 2019-03-20 MED ORDER — MEMANTINE HCL 5 MG PO TABS
5.0000 mg | ORAL_TABLET | Freq: Two times a day (BID) | ORAL | Status: DC
  Administered 2019-03-20 – 2019-03-21 (×3): 5 mg via ORAL
  Filled 2019-03-20 (×3): qty 1

## 2019-03-20 MED ORDER — LISINOPRIL 10 MG PO TABS
10.0000 mg | ORAL_TABLET | Freq: Every day | ORAL | Status: DC
  Administered 2019-03-20 – 2019-03-21 (×2): 10 mg via ORAL
  Filled 2019-03-20 (×2): qty 1

## 2019-03-20 MED ORDER — HYDROCHLOROTHIAZIDE 12.5 MG PO CAPS
12.5000 mg | ORAL_CAPSULE | Freq: Every day | ORAL | Status: DC
  Administered 2019-03-20 – 2019-03-21 (×2): 12.5 mg via ORAL
  Filled 2019-03-20 (×2): qty 1

## 2019-03-20 MED ORDER — LISINOPRIL-HYDROCHLOROTHIAZIDE 10-12.5 MG PO TABS: 1.0000 | ORAL_TABLET | Freq: Every day | ORAL | Status: DC

## 2019-03-20 MED ORDER — VITAMIN B-12 1000 MCG PO TABS
1000.0000 ug | ORAL_TABLET | Freq: Every day | ORAL | Status: DC
  Administered 2019-03-20 – 2019-03-21 (×2): 1000 ug via ORAL
  Filled 2019-03-20 (×2): qty 1

## 2019-03-20 MED ORDER — ONDANSETRON HCL 4 MG/2ML IJ SOLN: 4.0000 mg | Freq: Four times a day (QID) | INTRAMUSCULAR | Status: DC | PRN

## 2019-03-20 MED ORDER — ACETAMINOPHEN 650 MG RE SUPP: 650.0000 mg | Freq: Four times a day (QID) | RECTAL | Status: DC | PRN

## 2019-03-20 MED ORDER — SODIUM CHLORIDE 0.9 % IV SOLN
1.0000 g | INTRAVENOUS | Status: DC
  Administered 2019-03-20: 1 g via INTRAVENOUS
  Filled 2019-03-20: qty 1
  Filled 2019-03-20: qty 10

## 2019-03-20 MED ORDER — MIRTAZAPINE 15 MG PO TABS
7.5000 mg | ORAL_TABLET | Freq: Every evening | ORAL | Status: DC
  Administered 2019-03-20: 7.5 mg via ORAL
  Filled 2019-03-20: qty 1

## 2019-03-20 MED ORDER — ACETAMINOPHEN 325 MG PO TABS: 650.0000 mg | ORAL_TABLET | Freq: Four times a day (QID) | ORAL | Status: DC | PRN

## 2019-03-20 MED ORDER — SODIUM CHLORIDE 0.9 % IV SOLN
INTRAVENOUS | Status: AC
  Administered 2019-03-20: 15:00:00 via INTRAVENOUS

## 2019-03-20 NOTE — ED Notes (Signed)
This RN remains with pt as he continues to try and have a BM. Educated pt shouldn't stay on bedpan too long as it make hurt sacral skin. Pt wants to try for a few more mins.

## 2019-03-20 NOTE — TOC Initial Note (Signed)
Transition of Care [facility](TOC) - Initial/Assessment Note    Patient Details  Name: Elijah Dominguez MRN: [id number] Date of Birth: 1936-02-16  Transition of Care [facility](TOC) CM/SW Contact:    [name], RN Phone Number: 03/20/2019, 4:30 PM  Clinical Narrative:                 Sherron MondaySpoke with [name] the patients daughter on the phone, She stated that the patient lives at home with his adult children and grandson.  He has a RW and a cane at home.  I explained the progress he made with PT and that the recommendation is to go to SNF, She stated it was ok to do a bed search but they do not want to say yes to anything until the whole family speaks to one another about it,  [name] will speak to the family and let me know the outcome.  I submitted a request for a PASSR and it is pending review, Still needs  FL2 and Bed search once obtained PASSR.  Will continue to Monitor for needs and send Bed search once PASSR is obtained  Expected Discharge Plan: Skilled Nursing Facility Barriers to Discharge: Continued Medical Work up   Patient Goals and CMS Choice Patient states their goals for this hospitalization and ongoing recovery are:: daughter states that they want to get him well CMS Medicare.gov Compare Post Acute Care list provided to:: Patient Represenative (must comment)([name], daughter) Choice offered to / list presented to : Adult Children  Expected Discharge Plan and Services Expected Discharge Plan: Skilled Nursing Facility   Discharge Planning Services: CM Consult   Living arrangements for the past 2 months: Single Family Home                 DME Arranged: N/A                    Prior Living Arrangements/Services Living arrangements for the past 2 months: Single Family Home Lives with:: Adult Children Patient language and need for interpreter reviewed:: No Do you feel safe going back to the place where you live?: Yes      Need for Family Participation in Patient Care: Yes  (Comment) Care giver support system in place?: Yes (comment) Current home services: DME(RW, cane) Criminal Activity/Legal Involvement Pertinent to Current Situation/Hospitalization: No - Comment as needed  Activities of Daily Living Home Assistive Devices/Equipment: Cane (specify quad or straight) ADL Screening (condition at time of admission) Patient's cognitive ability adequate to safely complete daily activities?: No Is the patient deaf or have difficulty hearing?: No Does the patient have difficulty seeing, even when wearing glasses/contacts?: No Does the patient have difficulty concentrating, remembering, or making decisions?: No Patient able to express need for assistance with ADLs?: Yes Does the patient have difficulty dressing or bathing?: No Independently performs ADLs?: Yes (appropriate for developmental age) Does the patient have difficulty walking or climbing stairs?: No Weakness of Legs: Both Weakness of Arms/Hands: None  Permission Sought/Granted Permission sought to share information with : Case Manager Permission granted to share information with : Yes, Verbal Permission Granted     Permission granted to share info w AGENCY: SNF or HH        Emotional Assessment Appearance:: Appears stated age Attitude/Demeanor/Rapport: Engaged Affect (typically observed): Accepting Orientation: : Oriented to Self, Oriented to Place Alcohol / Substance Use: Not Applicable Psych Involvement: No (comment)  Admission diagnosis:  Urinary tract infection without hematuria, site unspecified [N39.0] Altered mental status,  unspecified altered mental status type [R41.82] Patient Active Problem List   Diagnosis Date Noted  . HTN (hypertension) 03/19/2019  . Acute encephalopathy 03/19/2019  . CKD (chronic kidney disease), stage III ([place]) 03/19/2019  . UTI (urinary tract infection) 03/19/2019  . Acute on chronic renal failure ([place]) 10/01/2016  . Urinary tract infection 10/01/2016  .  Acute urinary retention 10/01/2016  . Generalized weakness 10/01/2016  . Leukocytosis 10/01/2016  . Diarrhea 10/01/2016  . Left shoulder pain 10/01/2016   PCP:  Tracie Harrier, MD Pharmacy:   [id number] - [place], [place] - [address] [address] Phone: [phone] Fax: [phone]     Social Determinants of Health (SDOH) Interventions    Readmission Risk Interventions No flowsheet data found.

## 2019-03-20 NOTE — Evaluation (Signed)
Physical Therapy Evaluation Patient Details Name: Elijah Dominguez MRN: [id number] DOB: 1936/08/23 Today's Date: 03/20/2019   History of Present Illness  Patient is an 83 y/o male that presents with abdominal pain, nausea, and weakness. He is being managed for a UTI.  Clinical Impression  Patient is an 83 y/o male that presents with UTI, he has a baseline history of dementia. He requires mod A x 1 with significant cuing for sequencing, hand placement, and rocking forward to safely complete transfer. He is heavily dependent on therapist to provide cuing to safely maneuver RW as he tends to push it outside his base of support with ambulation. Noted to fatigue quickly ~20' and desaturate on room air from 96 to 92%, which appears to be related to deconditioning as he is asymptomatic. Given his physical needs he would benefit from skilled PT services at SNF at discharge to improve his functional mobility.     Follow Up Recommendations SNF    Equipment Recommendations       Recommendations for Other Services       Precautions / Restrictions Precautions Precautions: Fall Restrictions Weight Bearing Restrictions: No      Mobility  Bed Mobility               General bed mobility comments: Patient seated at EOB when therapist entered the room.  Transfers Overall transfer level: Needs assistance   Transfers: Sit to/from Stand Sit to Stand: Mod assist         General transfer comment: Patient requires mod A x 1 with cuing for hand placement to complete transfer(s).  Ambulation/Gait Ambulation/Gait assistance: Min guard;Min assist Gait Distance (Feet): 20 Feet Assistive device: Rolling walker (2 wheeled) Gait Pattern/deviations: Step-to pattern   Gait velocity interpretation: <1.31 ft/sec, indicative of household ambulator General Gait Details: Patient takes short steps, cuing for walker placement. Needs frequent reminders for increasing stride length, keeping eyes forward, and  walker placement.  Stairs            Wheelchair Mobility    Modified Rankin (Stroke Patients Only)       Balance Overall balance assessment: Needs assistance;History of Falls Sitting-balance support: Bilateral upper extremity supported Sitting balance-Leahy Scale: Good     Standing balance support: Bilateral upper extremity supported Standing balance-Leahy Scale: Fair                               Pertinent Vitals/Pain Pain Assessment: No/denies pain    Home Living Family/patient expects to be discharged to:: Private residence Living Arrangements: Children Available Help at Discharge: Family;Available PRN/intermittently Type of Home: House Home Access: Stairs to enter Entrance Stairs-Rails: None Entrance Stairs-Number of Steps: 3 Home Layout: Two level;Able to live on main level with bedroom/bathroom Home Equipment: Kasandra Knudsen - single point      Prior Function Level of Independence: Independent with assistive device(s)         Comments: Per old note patient uses [facility], patient is not a good historian.     Hand Dominance        Extremity/Trunk Assessment   Upper Extremity Assessment Upper Extremity Assessment: Overall WFL for tasks assessed    Lower Extremity Assessment Lower Extremity Assessment: Overall WFL for tasks assessed       Communication   Communication: No difficulties  Cognition Arousal/Alertness: Awake/alert Behavior During Therapy: WFL for tasks assessed/performed Overall Cognitive Status: History of cognitive impairments - at baseline  General Comments      Exercises General Exercises - Lower Extremity Hip Flexion/Marching: Both;AROM;20 reps;Standing(with RW support) Other Exercises Other Exercises: Sit to stands with cuing for hand placement x 8 with mod A x 1 Other Exercises: Single leg stance with RW support x 7 for 5-10" holds bil Other Exercises: Additional  bout of gait x 20' with cuing for RW placement, min guard assistance, cuing for increasing stride length.   Assessment/Plan    PT Assessment Patient needs continued PT services  PT Problem List Decreased strength;Decreased mobility;Decreased activity tolerance;Decreased safety awareness;Cardiopulmonary status limiting activity;Decreased knowledge of use of DME;Decreased balance       PT Treatment Interventions DME instruction;Therapeutic exercise;Gait training;Balance training;Stair training;Neuromuscular re-education;Therapeutic activities;Patient/family education    PT Goals (Current goals can be found in the Care Plan section)  Acute Rehab PT Goals Patient Stated Goal: To return home safely. PT Goal Formulation: With patient Time For Goal Achievement: 04/03/19 Potential to Achieve Goals: Good    Frequency Min 2X/week   Barriers to discharge        Co-evaluation               AM-PAC PT "6 Clicks" Mobility  Outcome Measure Help needed turning from your back to your side while in a flat bed without using bedrails?: A Little Help needed moving from lying on your back to sitting on the side of a flat bed without using bedrails?: A Little Help needed moving to and from a bed to a chair (including a wheelchair)?: A Lot Help needed standing up from a chair using your arms (e.g., wheelchair or bedside chair)?: A Lot Help needed to walk in hospital room?: A Little Help needed climbing 3-5 steps with a railing? : A Lot 6 Click Score: 15    End of Session Equipment Utilized During Treatment: Gait belt Activity Tolerance: Patient tolerated treatment well Patient left: in chair;with chair alarm set;with call bell/phone within reach Nurse Communication: Mobility status PT Visit Diagnosis: Unsteadiness on feet (R26.81)    Time: [id number] PT Time Calculation (min) (ACUTE ONLY): 24 min   Charges:   PT Evaluation $PT Eval Moderate Complexity: 1 Mod PT Treatments $Therapeutic  Exercise: 8-22 mins       Alva GarnetPatrick Lariah Fleer PT, DPT, CSCS    03/20/2019, 1:59 PM

## 2019-03-20 NOTE — ED Notes (Addendum)
Pt assisted off of bedpan. Pt had smear of BM. Soft. Peri care provided.

## 2019-03-20 NOTE — Progress Notes (Addendum)
   03/20/19 1700  Clinical Encounter Type  Visited With Patient  Visit Type Spiritual support;Social support  Referral From Nurse  Spiritual Encounters  Spiritual Needs Prayer;Emotional   Chaplain received a page to provide support to the patient in the wake of him becoming tearful, as reported by the patient's nurse. Upon arrival, the patient was sitting up in bed and he was awake and alert. The patient was welcoming and conversational. The chaplain engaged the patient in brief conversation about his hospital stay, important life events and lessons, as well as important relationships with his late wife, children, childhood friends, and his mother. The patient indicated that thoughts of his mother were the catalyst for his earlier sadness. The chaplain engaged the patient in deeper conversation about his mother and some of the important life lessons she instilled in him and his siblings, which he remembers fondly. The chaplain provided support in the form of compassionate presence, active and reflective listening, and prayer. Prayer offered for the patient's health, his family and friends, and the entire world.

## 2019-03-20 NOTE — Progress Notes (Signed)
[employer] - [place] at [facility]   PATIENT NAME: Elijah Dominguez    MR#:  [id number]  DATE OF BIRTH:  15-Jan-1936  SUBJECTIVE:  CHIEF COMPLAINT:   Chief Complaint  Patient presents with   Altered Mental Status   -Brought in from home secondary to increased confusion and weakness. -Denies any symptoms at this time.  Noted to have UTI on admission  REVIEW OF SYSTEMS:  Review of Systems  Constitutional: Negative for chills and fever.  HENT: Negative for congestion, ear discharge, hearing loss and nosebleeds.   Eyes: Negative for blurred vision and double vision.  Respiratory: Negative for cough, shortness of breath and wheezing.   Cardiovascular: Negative for chest pain and palpitations.  Gastrointestinal: Negative for abdominal pain, constipation, diarrhea, nausea and vomiting.  Genitourinary: Negative for dysuria.  Musculoskeletal: Negative for myalgias.  Neurological: Positive for weakness. Negative for dizziness, focal weakness, seizures and headaches.  Psychiatric/Behavioral: Negative for depression.    DRUG ALLERGIES:  No Known Allergies  VITALS:  Blood pressure (!) 164/74, pulse (!) 51, temperature 98 F (36.7 C), temperature source Oral, resp. rate 17, SpO2 96 %.  PHYSICAL EXAMINATION:  Physical Exam   GENERAL:  83 y.o.-year-old elderly patient lying in the bed with no acute distress.  EYES: Pupils equal, round, reactive to light and accommodation. No scleral icterus. Extraocular muscles intact.  HEENT: Head atraumatic, normocephalic. Oropharynx and nasopharynx clear.  NECK:  Supple, no jugular venous distention. No thyroid enlargement, no tenderness.  LUNGS: Normal breath sounds bilaterally, no wheezing, rales,rhonchi or crepitation. No use of accessory muscles of respiration.  CARDIOVASCULAR: S1, S2 normal. No  rubs, or gallops.  2/6 systolic murmur is present ABDOMEN: Soft, nontender, nondistended. Bowel sounds present. No organomegaly or mass.    EXTREMITIES: No pedal edema, cyanosis, or clubbing.  NEUROLOGIC: Cranial nerves II through XII are intact. Muscle strength 5/5 in all extremities. Sensation intact. Gait not checked.  PSYCHIATRIC: The patient is alert and oriented x 2.  SKIN: No obvious rash, lesion, or ulcer.    LABORATORY PANEL:   CBC Recent Labs  Lab 03/20/19 0325  WBC 7.3  HGB 10.3*  HCT 33.4*  PLT 223   ------------------------------------------------------------------------------------------------------------------  Chemistries  Recent Labs  Lab 03/19/19 1858 03/20/19 0325  NA 144  --   K 3.7  --   CL 108  --   CO2 27  --   GLUCOSE 186*  --   BUN 40*  --   CREATININE 1.47* 1.24  CALCIUM 8.7*  --   AST 16  --   ALT 10  --   ALKPHOS 69  --   BILITOT 0.2*  --    ------------------------------------------------------------------------------------------------------------------  Cardiac Enzymes No results for input(s): TROPONINI in the last 168 hours. ------------------------------------------------------------------------------------------------------------------  RADIOLOGY:  Ct Head Wo Contrast  Result Date: 03/19/2019 CLINICAL DATA:  Altered mental status for 1 day. History of dementia. EXAM: CT HEAD WITHOUT CONTRAST TECHNIQUE: Contiguous axial images were obtained from the base of the skull through the vertex without intravenous contrast. COMPARISON:  Brain MRI 06/05/2018 FINDINGS: Brain: There is no evidence of acute large territory infarct, intracranial hemorrhage, mass, midline shift, or extra-axial fluid collection. Chronic lacunar infarcts are again seen in the left cerebellum, bilateral basal ganglia, thalami, and cerebral white matter. Additional confluent cerebral white matter hypodensities bilaterally are nonspecific but compatible with severe chronic small vessel ischemic disease. There is moderate cerebral atrophy. Vascular: Calcified atherosclerosis at the skull base. No hyperdense  vessel.  Skull: No fracture or focal osseous lesion. Sinuses/Orbits: Visualized paranasal sinuses and mastoid air cells are clear. Orbits are unremarkable. Other: None. IMPRESSION: 1. No evidence of acute intracranial abnormality. 2. Severe chronic small vessel ischemic disease. Electronically Signed   By: Logan Bores M.D.   On: 03/19/2019 22:17    EKG:   Orders placed or performed during the hospital encounter of 03/19/19   EKG 12-Lead   EKG 12-Lead    ASSESSMENT AND PLAN:   83 year old male with past medical history significant for hypertension, arthritis, neuropathy was brought in from home secondary to worsening weakness and confusion.  1.  Acute cystitis-follow-up urine cultures -WBC is within normal limits. -Continue Rocephin  2.  Acute renal failure-prerenal causes.  Improving with IV fluids -Avoid nephrotoxins  3.  Hypertension-on lisinopril and hydrochlorothiazide  4.  Neuropathy-hold gabapentin given altered mental status.  5.  Dementia-continue Namenda, also on Remeron  6.  DVT prophylaxis-subcutaneous heparin  Physical therapy consult    All the records are reviewed and case discussed with Care Management/Social Workerr. Management plans discussed with the patient, family and they are in agreement.  CODE STATUS: Full code  TOTAL TIME TAKING CARE OF THIS PATIENT: 38 minutes.   POSSIBLE D/C IN 1-2 DAYS, DEPENDING ON CLINICAL CONDITION.   Gladstone Lighter M.D on 03/20/2019 at 12:22 PM  Between 7am to 6pm - Pager - [phone]  After 6pm go to [email or web address] - password [facility]  Office  [phone]  CC: Primary care physician; Tracie Harrier, MD

## 2019-03-20 NOTE — Psychosocial Assessment (Signed)
Pt agitated, trying to get out of bed saying that he wants to go home. Pulled out his IV. Dr Bridgett Larsson made aware. Order for 1:1 sitter received.

## 2019-03-21 DIAGNOSIS — N3000 Acute cystitis without hematuria: Secondary | ICD-10-CM | POA: Diagnosis not present

## 2019-03-21 DIAGNOSIS — R4182 Altered mental status, unspecified: Secondary | ICD-10-CM | POA: Diagnosis not present

## 2019-03-21 LAB — BASIC METABOLIC PANEL
Anion gap: 6 (ref 5–15)
BUN: 24 mg/dL — ABNORMAL HIGH (ref 8–23)
CO2: 29 mmol/L (ref 22–32)
Calcium: 8.8 mg/dL — ABNORMAL LOW (ref 8.9–10.3)
Chloride: 104 mmol/L (ref 98–111)
Creatinine, Ser: 1.05 mg/dL (ref 0.61–1.24)
GFR calc Af Amer: >60 mL/min (ref >60)
GFR calc non Af Amer: >60 mL/min (ref >60)
Glucose, Bld: 116 mg/dL — ABNORMAL HIGH (ref 70–99)
Potassium: 3.3 mmol/L — ABNORMAL LOW (ref 3.5–5.1)
Sodium: 139 mmol/L (ref 135–145)

## 2019-03-21 LAB — NOVEL CORONAVIRUS, NAA (HOSP ORDER, SEND-OUT TO REF LAB; TAT 18-24 HRS): SARS-CoV-2, NAA: NOT DETECTED

## 2019-03-21 MED ORDER — GABAPENTIN 300 MG PO CAPS: 300.0000 mg | ORAL_CAPSULE | Freq: Two times a day (BID) | ORAL | 0 refills | Status: DC | PRN

## 2019-03-21 MED ORDER — CEPHALEXIN 500 MG PO CAPS: 500.0000 mg | ORAL_CAPSULE | Freq: Three times a day (TID) | ORAL | 0 refills | Status: AC

## 2019-03-21 NOTE — TOC Progression Note (Signed)
Transition of Care [facility]) - Progression Note    Patient Details  Name: Elijah Dominguez MRN: [id number] Date of Birth: Feb 23, 1936  [facility]) CM/SW Contact  Su Hilt, RN Phone Number: 03/21/2019, 8:58 AM  Clinical Narrative:    Spoke with the daughter Olivia Mackie, she stated that the family has decided to decline the patient going to SNF, they will take care of him at home, the patient has canes and walkers at home and would benefit from a [facility], I notified Leroy Sea of the need, They would like to use [facility] for [facility] and I notified [place] with [facility]. I will call Linus Orn once we determine when the patient will be Discharged   Expected Discharge Plan: [place] Barriers to Discharge: Continued Medical Work up  Expected Discharge Plan and Services Expected Discharge Plan: [place]   Discharge Planning Services: CM Consult   Living arrangements for the past 2 months: Single Family Home                 DME Arranged: N/A                     Social Determinants of Health (SDOH) Interventions    Readmission Risk Interventions No flowsheet data found.

## 2019-03-21 NOTE — Progress Notes (Signed)
VSS. PIV removed. All belongings packed. Daughter, Linus Orn came to pick up patient. This nurse went over printed AVS and discharge instructions. No questions or concerns voiced at this time.   Bethann Punches, RN

## 2019-03-21 NOTE — Discharge Summary (Signed)
[employer] - [place] at [facility]   PATIENT NAME: Elijah Dominguez    MR#:  [id number]  DATE OF BIRTH:  1936/04/10  DATE OF ADMISSION:  03/19/2019   ADMITTING PHYSICIAN: Oralia Manisavid Willis, MD  DATE OF DISCHARGE:  03/21/19  PRIMARY CARE PHYSICIAN: Barbette ReichmannHande, Vishwanath, MD   ADMISSION DIAGNOSIS:   Urinary tract infection without hematuria, site unspecified [N39.0] Altered mental status, unspecified altered mental status type [R41.82]  DISCHARGE DIAGNOSIS:   Principal Problem:   Urinary tract infection Active Problems:   HTN (hypertension)   Acute encephalopathy   CKD (chronic kidney disease), stage III (HCC)   UTI (urinary tract infection)   SECONDARY DIAGNOSIS:   Past Medical History:  Diagnosis Date  . Arthritis   . Hypertension   . Neuropathy     HOSPITAL COURSE:   83 year old male with past medical history significant for hypertension, arthritis, neuropathy was brought in from home secondary to worsening weakness and confusion.  1.  Acute cystitis-urine cultures growing E. coli.  Received Rocephin in the hospital. -WBC is within normal limits.  Will be discharged on Keflex  2.  Acute renal failure-prerenal causes.  Improved with IV fluids -Avoid nephrotoxins  3.  Hypertension-on lisinopril and hydrochlorothiazide  4.  Neuropathy-gabapentin changed to as needed at discharge  5.  Dementia-continue Namenda, also on Remeron -Patient had sundowning episode in the hospital requiring a sitter.  Hopefully it will improve after discharge  Physical therapy worked with patient and they have recommended rehab, however family refused and they have equipment and people to help him at home.  So patient is being discharged home with home health services Updated daughter prior to discharge  DISCHARGE CONDITIONS:   Guarded  CONSULTS OBTAINED:   None  DRUG ALLERGIES:   No Known Allergies DISCHARGE MEDICATIONS:   Allergies as of 03/21/2019   No Known  Allergies     Medication List    TAKE these medications   cephALEXin 500 MG capsule Commonly known as: KEFLEX Take 1 capsule (500 mg total) by mouth 3 (three) times daily for 7 days.   gabapentin 300 MG capsule Commonly known as: NEURONTIN Take 1 capsule (300 mg total) by mouth 2 (two) times daily as needed (neuropathy pain). What changed:   when to take this  reasons to take this   lisinopril-hydrochlorothiazide 10-12.5 MG tablet Commonly known as: ZESTORETIC Take 1 tablet by mouth daily.   memantine 5 MG tablet Commonly known as: NAMENDA Take 5 mg by mouth 2 (two) times daily.   mirtazapine 7.5 MG tablet Commonly known as: REMERON Take 7.5 mg by mouth every evening.   vitamin B-12 1000 MCG tablet Commonly known as: CYANOCOBALAMIN Take 1,000 mcg by mouth daily.            Durable Medical Equipment  (From admission, onward)         Start     Ordered   03/21/19 0905  For home use only DME Bedside commode  Once    Question:  Patient needs a bedside commode to treat with the following condition  Answer:  Need for assistance due to unsteady gait   03/21/19 0905           DISCHARGE INSTRUCTIONS:   1. PCP f/u in 1-2 weeks  DIET:   Cardiac diet  ACTIVITY:   Activity as tolerated  OXYGEN:   Home Oxygen: No.  Oxygen Delivery: room air  DISCHARGE LOCATION:   home   If you experience  worsening of your admission symptoms, develop shortness of breath, life threatening emergency, suicidal or homicidal thoughts you must seek medical attention immediately by calling 911 or calling your MD immediately  if symptoms less severe.  You Must read complete instructions/literature along with all the possible adverse reactions/side effects for all the Medicines you take and that have been prescribed to you. Take any new Medicines after you have completely understood and accpet all the possible adverse reactions/side effects.   Please note  You were cared for  by a hospitalist during your hospital stay. If you have any questions about your discharge medications or the care you received while you were in the hospital after you are discharged, you can call the unit and asked to speak with the hospitalist on call if the hospitalist that took care of you is not available. Once you are discharged, your primary care physician will handle any further medical issues. Please note that NO REFILLS for any discharge medications will be authorized once you are discharged, as it is imperative that you return to your primary care physician (or establish a relationship with a primary care physician if you do not have one) for your aftercare needs so that they can reassess your need for medications and monitor your lab values.    On the day of Discharge:  VITAL SIGNS:   Blood pressure (!) 154/67, pulse (!) 55, temperature 98.4 F (36.9 C), temperature source Oral, resp. rate 18, SpO2 99 %.  PHYSICAL EXAMINATION:     GENERAL:  83 y.o.-year-old elderly patient lying in the bed with no acute distress.  EYES: Pupils equal, round, reactive to light and accommodation. No scleral icterus. Extraocular muscles intact.  HEENT: Head atraumatic, normocephalic. Oropharynx and nasopharynx clear.  NECK:  Supple, no jugular venous distention. No thyroid enlargement, no tenderness.  LUNGS: Normal breath sounds bilaterally, no wheezing, rales,rhonchi or crepitation. No use of accessory muscles of respiration.  CARDIOVASCULAR: S1, S2 normal. No  rubs, or gallops.  2/6 systolic murmur is present ABDOMEN: Soft, nontender, nondistended. Bowel sounds present. No organomegaly or mass.  EXTREMITIES: No pedal edema, cyanosis, or clubbing.  NEUROLOGIC: Cranial nerves II through XII are intact. Muscle strength 5/5 in all extremities. Sensation intact. Gait not checked.  PSYCHIATRIC: The patient is alert and oriented x 2. Intermittent confusion noted. SKIN: No obvious rash, lesion, or ulcer.    DATA REVIEW:   CBC Recent Labs  Lab 03/20/19 0325  WBC 7.3  HGB 10.3*  HCT 33.4*  PLT 223    Chemistries  Recent Labs  Lab 03/19/19 1858  03/21/19 0305  NA 144  --  139  K 3.7  --  3.3*  CL 108  --  104  CO2 27  --  29  GLUCOSE 186*  --  116*  BUN 40*  --  24*  CREATININE 1.47*   < > 1.05  CALCIUM 8.7*  --  8.8*  AST 16  --   --   ALT 10  --   --   ALKPHOS 69  --   --   BILITOT 0.2*  --   --    < > = values in this interval not displayed.     Microbiology Results  Results for orders placed or performed during the hospital encounter of 03/19/19  Urine Culture     Status: Abnormal (Preliminary result)   Collection Time: 03/19/19  8:56 PM   Specimen: Urine, Random  Result Value Ref Range Status  Specimen Description   Final    URINE, RANDOM Performed at [facility], [address] [place], [place] [address]    Special Requests   Final    NONE Performed at [facility], [address] [place], [place] [address]    Culture >=100,000 COLONIES/mL ESCHERICHIA COLI (A)  Final   Report Status PENDING  Incomplete  Novel Coronavirus,NAA,(SEND-OUT TO REF LAB - TAT 24-48 hrs); Hosp Order     Status: None   Collection Time: 03/19/19 10:58 PM   Specimen: Nasopharyngeal Swab; Respiratory  Result Value Ref Range Status   SARS-CoV-2, NAA NOT DETECTED NOT DETECTED Final    Comment: (NOTE) This test was developed and its performance characteristics determined by [facility]. This test has not been FDA cleared or approved. This test has been authorized by FDA under an Emergency Use Authorization (EUA). This test is only authorized for the duration of time the declaration that circumstances exist justifying the authorization of the emergency use of in vitro diagnostic tests for detection of SARS-CoV-2 virus and/or diagnosis of COVID-19 infection under section 564(b)(1) of the Act, 21 U.S.C. 098JXB-1(Y)(7360bbb-3(b)(1), unless the authorization is  terminated or revoked sooner. When diagnostic testing is negative, the possibility of a false negative result should be considered in the context of a patient's recent exposures and the presence of clinical signs and symptoms consistent with COVID-19. An individual without symptoms of COVID-19 and who is not shedding SARS-CoV-2 virus would expect to have a negative (not detected) result in this assay. Performed  At: [facility] [place] [address] [place], [place] [id number] Jolene SchimkeNagendra Sanjai MD QM:[id number]:[phone]    Coronavirus Source NASOPHARYNGEAL  Final    Comment: Performed at [facility], [address] [place], [place] [address]    RADIOLOGY:  No results found.   Management plans discussed with the patient, family and they are in agreement.  CODE STATUS:     Code Status Orders  (From admission, onward)         Start     Ordered   03/20/19 0140  Full code  Continuous     03/20/19 0140        Code Status History    Date Active Date Inactive Code Status Order ID Comments User Context   10/01/2016 1924 10/03/2016 1808 Full Code [id number]  Katharina CaperVaickute, Rima, MD Inpatient   Advance Care Planning Activity      TOTAL TIME TAKING CARE OF THIS PATIENT: 38 minutes.    Enid Baasadhika Jujhar Everett M.D on 03/21/2019 at 2:01 PM  Between 7am to 6pm - Pager - [phone]  After 6pm go to [email or web address] - Social research officer, governmentpassword [email or web address]  [employer] [facility]  Office  [phone]  CC: Primary care physician; Barbette ReichmannHande, Vishwanath, MD   Note: This dictation was prepared with Dragon dictation along with smaller phrase technology. Any transcriptional errors that result from this process are unintentional.

## 2019-03-21 NOTE — Progress Notes (Signed)
Updated daughter about the discharge plans- she is agreeable. Patient to be discharged home today with home health

## 2019-03-21 NOTE — TOC Transition Note (Signed)
Transition of Care [facility]) - CM/SW Discharge Note   Patient Details  Name: Elijah Dominguez MRN: [id number] Date of Birth: 1936/01/06  [facility]) CM/SW Contact:  Su Hilt, RN Phone Number: 03/21/2019, 12:40 PM   Clinical Narrative:     Patient is set up with [facility] and [place] aware of the DC, I called and notified the Daughter Linus Orn of the DC and that the 3 in 1 will be brought to his room before he leaves, Brad with [employer] notified that the patient is discharging.  No further needs  Final next level of care: Home w Home Health Services Barriers to Discharge: Barriers Resolved   Patient Goals and CMS Choice Patient states their goals for this hospitalization and ongoing recovery are:: daughter states that they want to get him well CMS Medicare.gov Compare Post Acute Care list provided to:: Patient Represenative (must comment)([name], daughter) Choice offered to / list presented to : Adult Children  Discharge Placement                       Discharge Plan and Services   Discharge Planning Services: CM Consult            DME Arranged: N/A                    Social Determinants of Health (SDOH) Interventions     Readmission Risk Interventions No flowsheet data found.

## 2019-03-22 LAB — URINE CULTURE: Culture: >=100000 — AB

## 2019-05-23 ENCOUNTER — Other Ambulatory Visit: Payer: Self-pay

## 2019-05-23 ENCOUNTER — Ambulatory Visit: Payer: Medicare HMO | Admitting: Urology

## 2019-05-23 ENCOUNTER — Encounter: Payer: Self-pay | Admitting: Urology

## 2019-05-23 VITALS — BP 142/64 | HR 54 | Ht 75.0 in | Wt 176.0 lb

## 2019-05-23 DIAGNOSIS — Z87440 Personal history of urinary (tract) infections: Secondary | ICD-10-CM

## 2019-05-23 DIAGNOSIS — R351 Nocturia: Secondary | ICD-10-CM | POA: Diagnosis not present

## 2019-05-23 DIAGNOSIS — R339 Retention of urine, unspecified: Secondary | ICD-10-CM

## 2019-05-23 DIAGNOSIS — N433 Hydrocele, unspecified: Secondary | ICD-10-CM | POA: Diagnosis not present

## 2019-05-23 LAB — BLADDER SCAN AMB NON-IMAGING

## 2019-05-23 NOTE — Progress Notes (Signed)
05/23/2019 1:22 PM   Elijah Dominguez 1936/06/23 [id number]  Referring provider: Barbette Reichmann, MD [address] [facility] [place],  [place] [address]  Chief Complaint  Patient presents with  . Groin Swelling    HPI: 82 year old male with a history of urinary retention/BPH status post thulium laser ablation of the prostate 2018 who returns today for evaluation of a hydrocele.  He is accompanied by his daughter today.  He provides little to no history given his dementia.  His daughter reports that she first noticed bilateral scrotal swelling about 6   Notably, hydroceles are appreciated on CT scan dating back to 2018.months ago.  It appears to have progressed.  She reports that he occasionally has discomfort which is primarily when he is sitting.  This improves with readjustment.  He does not appear to be in severe pain.  He is not complained about this for the most part.  Elijah Dominguez patient been doing fairly well in terms of his urinary symptoms off of BPH medications.  He has a personal history of urinary retention in 2018 and failed multiple voiding trials on maximal medical therapy.  Ultimately, he underwent thulium laser ablation of the prostate has been voiding spontaneously since on no meds.  He was admitted in 02/2019 for an E. coli urinary tract infection for 2-day admission.  His daughter mention today that he has had in fact 2 urinary tract infections this past year.  1 during the admission as above when he was clinically ill.  More recently, he had a urinalysis checked by his primary care physician at which time he was asymptomatic.  He was low to void into a hat.  Notably, he is uncircumcised and demented.  UA on 817 showed 10-50 white blood cells, 4-10 red blood cells, many bacteria.  Urine culture grew E. coli.Marland Kitchen  He was started on antibiotics, doxycycline for 10 days which she recently completed.  His daughter reports that he is continent during the daytime.   He voids every 2-3 hours with a good stream.  He has no urinary complaints during the day.  At nighttime, he does have nocturia x2-3 as well as urinary incontinence.  He is also incontinent of stool.  He wears a pull-up.  PSA 3.42 on 04/2019, stable from 2 years ago.  Creatinine 1.3 on 05/08/2019.   PMH: Past Medical History:  Diagnosis Date  . Arthritis   . Hypertension   . Neuropathy     Surgical History: Past Surgical History:  Procedure Laterality Date  . THULIUM LASER TURP (TRANSURETHRAL RESECTION OF PROSTATE) N/A 11/30/2016   Procedure: THULIUM LASER TURP (TRANSURETHRAL RESECTION OF PROSTATE);  Surgeon: Vanna Scotland, MD;  Location: [facility] ORS;  Service: Urology;  Laterality: N/A;    Home Medications:  Allergies as of 05/23/2019   No Known Allergies     Medication List       Accurate as of May 23, 2019  1:22 PM. If you have any questions, ask your nurse or doctor.        gabapentin 300 MG capsule Commonly known as: NEURONTIN Take 1 capsule (300 mg total) by mouth 2 (two) times daily as needed (neuropathy pain).   lisinopril-hydrochlorothiazide 10-12.5 MG tablet Commonly known as: ZESTORETIC Take 1 tablet by mouth daily.   memantine 5 MG tablet Commonly known as: NAMENDA Take 5 mg by mouth 2 (two) times daily.   mirtazapine 7.5 MG tablet Commonly known as: REMERON Take 7.5 mg by mouth every  evening.   vitamin B-12 1000 MCG tablet Commonly known as: CYANOCOBALAMIN Take 1,000 mcg by mouth daily.       Allergies: No Known Allergies  Family History: Family History  Problem Relation Age of Onset  . Diabetes Mother   . Heart attack Father   . Prostate cancer Neg Hx   . Bladder Cancer Neg Hx   . Kidney cancer Neg Hx     Social History:  reports that he has never smoked. He has never used smokeless tobacco. He reports that he does not drink alcohol or use drugs.  ROS: UROLOGY Frequent Urination?: No Hard to postpone urination?: No Burning/pain  with urination?: No Get up at night to urinate?: No Leakage of urine?: No Urine stream starts and stops?: No Trouble starting stream?: No Do you have to strain to urinate?: No Blood in urine?: No Urinary tract infection?: Yes Sexually transmitted disease?: No Injury to kidneys or bladder?: No Painful intercourse?: No Weak stream?: No Erection problems?: No Penile pain?: No  Gastrointestinal Nausea?: No Vomiting?: No Indigestion/heartburn?: No Diarrhea?: No Constipation?: No  Constitutional Fever: No Night sweats?: No Weight loss?: No Fatigue?: No  Skin Skin rash/lesions?: No Itching?: No  Eyes Blurred vision?: No Double vision?: No  Ears/Nose/Throat Sore throat?: No Sinus problems?: No  Hematologic/Lymphatic Swollen glands?: No Easy bruising?: No  Cardiovascular Leg swelling?: No Chest pain?: No  Respiratory Cough?: No Shortness of breath?: No  Endocrine Excessive thirst?: No  Musculoskeletal Back pain?: No Joint pain?: No  Neurological Headaches?: No Dizziness?: No  Psychologic Depression?: No Anxiety?: No  Physical Exam: BP (!) 142/64   Pulse (!) 54   Ht 6\' 3"  (1.905 m)   Wt 176 lb (79.8 kg)   BMI 22.00 kg/m   Constitutional:  Alert and oriented, No acute distress.  Answers almost no questions.  Daughter provides history today.  In wheelchair, instability with standing. HEENT: [place] AT, moist mucus membranes.  Trachea midline, no masses. Cardiovascular: No clubbing, cyanosis, or edema. Respiratory: Normal respiratory effort, no increased work of breathing. GI: Abdomen is soft, nontender, nondistended, no abdominal masses GU: Uncircumcised phallus with easily retractable foreskin but evidence of chronic irritation and copious smegma.  Non-tense hydroceles, right (softball )greater than left (lemon).  Testicles are palpable within the hydrocele sac and are normal.  No palpable hernias bilaterally. Skin: No rashes, bruises or suspicious  lesions. Neurologic: Grossly intact, no focal deficits, moving all 4 extremities. Psychiatric: Normal mood and affect.  Laboratory Data: Lab Results  Component Value Date   WBC 7.3 03/20/2019   HGB 10.3 (L) 03/20/2019   HCT 33.4 (L) 03/20/2019   MCV 75.6 (L) 03/20/2019   PLT 223 03/20/2019    Lab Results  Component Value Date   CREATININE 1.05 03/21/2019    Pertinent Imaging: Results for orders placed or performed in visit on 05/23/19  Bladder Scan (Post Void Residual) in office  Result Value Ref Range   Scan Result 163LB     Assessment & Plan:    1. Hydrocele, unspecified hydrocele type We discussed the pathophysiology of hydrocele He has had these since at least 2018 Overall, his discomfort is minimal Would recommend readjustment with position change as needed Surgical intervention was also discussed with a 10% recurrence rate After discussing risk and benefits, they are most interested in conservative management - Urinalysis, Complete - Bladder Scan (Post Void Residual) in office  2. Incomplete bladder emptying Personal history of urinary retention with mildly elevated PVR today This  may be contributing factor to recent urinary tract infection several months ago Like to continue to follow him with serial PVRs, advised to let us know if he has any further true infections or increased difficulty voiding  3. Nocturia Patient is on Lasix and has mobility issues along with dementia likely contributing to this  4. History of UTI Isolated episode of urinary tract infection He was treated 2 weeks ago for presumed urinary tract infection however he was asymptomatic and the patient was allowed to provide his own sample despite having advanced dementia/uncircumcised and void into a hat I suspect this is a likely contaminated sample We discussed in the future, he should be supervised and void into a sterile container when providing a specimen or perhaps even catheterized to  ensure that we are treating the correct diagnosis His daughter works in the medical field and understands this completely, let us know if she sees any signs or symptoms of UTI in the future   Return in about 6 months (around 11/20/2019) for pvr Robinson.  Hollice Espy, MD  [facility] [address], [place], [place] [address] [phone]

## 2019-05-23 NOTE — Addendum Note (Signed)
Addended by: Verlene Mayer A on: 05/23/2019 04:27 PM   Modules accepted: Orders

## 2019-11-22 ENCOUNTER — Ambulatory Visit: Payer: Medicare HMO | Admitting: Urology

## 2019-11-24 ENCOUNTER — Ambulatory Visit: Payer: Medicare HMO | Admitting: Urology

## 2019-11-24 ENCOUNTER — Other Ambulatory Visit: Payer: Self-pay

## 2019-11-24 ENCOUNTER — Encounter: Payer: Self-pay | Admitting: Urology

## 2019-11-24 VITALS — BP 163/89 | HR 58 | Ht 75.0 in

## 2019-11-24 DIAGNOSIS — R339 Retention of urine, unspecified: Secondary | ICD-10-CM

## 2019-11-24 LAB — BLADDER SCAN AMB NON-IMAGING: Scan Result: 0

## 2019-11-24 NOTE — Progress Notes (Signed)
11/24/2019 11:10 AM   [name] November 09, 1935 [id number]  Referring provider: Barbette Reichmann, MD [address] [facility] [place],  [place] [address]  Chief Complaint  Patient presents with  . Benign Prostatic Hypertrophy    HPI: Mr. Elijah Dominguez is an 84 year old male with a history of urinary retention, BPH with LU TS and bilateral hydroceles who presents with his daughter, Elijah Dominguez.    Patient has dementia and therefore is a poor historian.  His daughter states that he has been doing very well.  She states that he is not had any complaints.  She has not noted any blood in his urine.  She states that he wears pull-ups and has incontinence.   Daughter denies any modifying or aggravating factors.  Daughter denies dysuria or suprapubic/flank pain.  Daughter denies any fevers, chills, nausea or vomiting.   He underwent Thulium laser ablation of his prostate on 11/30/2016 with Dr. Apolinar Junes after having episodes of retention despite being on maximal medical therapy.  His PVR today is 0 mL.      PMH: Past Medical History:  Diagnosis Date  . Arthritis   . Hypertension   . Neuropathy     Surgical History: Past Surgical History:  Procedure Laterality Date  . THULIUM LASER TURP (TRANSURETHRAL RESECTION OF PROSTATE) N/A 11/30/2016   Procedure: THULIUM LASER TURP (TRANSURETHRAL RESECTION OF PROSTATE);  Surgeon: Vanna Scotland, MD;  Location: [facility] ORS;  Service: Urology;  Laterality: N/A;    Home Medications:  Allergies as of 11/24/2019   No Known Allergies     Medication List       Accurate as of November 24, 2019 11:10 AM. If you have any questions, ask your nurse or doctor.        gabapentin 300 MG capsule Commonly known as: NEURONTIN Take 1 capsule (300 mg total) by mouth 2 (two) times daily as needed (neuropathy pain).   lisinopril 5 MG tablet Commonly known as: ZESTRIL Take 5 mg by mouth daily.   lisinopril-hydrochlorothiazide 10-12.5 MG  tablet Commonly known as: ZESTORETIC Take 1 tablet by mouth daily.   memantine 5 MG tablet Commonly known as: NAMENDA Take 5 mg by mouth 2 (two) times daily.   mirtazapine 7.5 MG tablet Commonly known as: REMERON Take 7.5 mg by mouth every evening.   vitamin B-12 1000 MCG tablet Commonly known as: CYANOCOBALAMIN Take 1,000 mcg by mouth daily.       Allergies: No Known Allergies  Family History: Family History  Problem Relation Age of Onset  . Diabetes Mother   . Heart attack Father   . Prostate cancer Neg Hx   . Bladder Cancer Neg Hx   . Kidney cancer Neg Hx     Social History:  reports that he has never smoked. He has never used smokeless tobacco. He reports that he does not drink alcohol or use drugs.  ROS: Pertinent ROS in HPI  Physical Exam: BP (!) 163/89   Pulse (!) 58   Ht 6\' 3"  (1.905 m)   BMI 22.00 kg/m   Constitutional:  Well nourished. Alert and oriented, No acute distress. HEENT: [place] AT, mask in place.  Trachea midline, no masses. Cardiovascular: No clubbing, cyanosis, or edema. Respiratory: Normal respiratory effort, no increased work of breathing. Neurologic: Grossly intact, no focal deficits, moving all 4 extremities. Psychiatric: Normal mood and affect.  Laboratory Data: Lab Results  Component Value Date   WBC 7.3 03/20/2019   HGB 10.3 (  L) 03/20/2019   HCT 33.4 (L) 03/20/2019   MCV 75.6 (L) 03/20/2019   PLT 223 03/20/2019    Lab Results  Component Value Date   CREATININE 1.05 03/21/2019    Lab Results  Component Value Date   AST 16 03/19/2019   Lab Results  Component Value Date   ALT 10 03/19/2019    Urinalysis    Component Value Date/Time   COLORURINE YELLOW (A) 03/19/2019 2056   APPEARANCEUR CLOUDY (A) 03/19/2019 2056   APPEARANCEUR Cloudy 01/26/2014 0137   LABSPEC 1.015 03/19/2019 2056   LABSPEC 1.009 01/26/2014 0137   PHURINE 5.0 03/19/2019 2056   GLUCOSEU NEGATIVE 03/19/2019 2056   GLUCOSEU 50 mg/dL 01/26/2014 0137    HGBUR SMALL (A) 03/19/2019 2056   BILIRUBINUR NEGATIVE 03/19/2019 2056   BILIRUBINUR Negative 01/26/2014 [place] 03/19/2019 2056   PROTEINUR NEGATIVE 03/19/2019 2056   NITRITE POSITIVE (A) 03/19/2019 2056   LEUKOCYTESUR LARGE (A) 03/19/2019 2056   LEUKOCYTESUR 3+ 01/26/2014 0137    I have reviewed the labs.   Pertinent Imaging: Results for ARIE, POWELL (MRN [id number]) as of 11/24/2019 11:09  Ref. Range 11/24/2019 10:45  Scan Result Unknown 0   Assessment & Plan:    1. Incomplete bladder emptying - Bladder Scan (Post Void Residual) in office - PVR nothing -Daughter advised to look for signs of urinary retention/UTI's, such as decreased urine output, straining to urinate, suprapubic pain, gross hematuria, fevers, chills, nausea and vomiting.     Return in about 6 months (around 05/26/2020) for PVR .  These notes generated with voice recognition software. I apologize for typographical errors.  Zara Council, PA-C  [facility] [address]  [place], [place] [address] [phone]

## 2020-05-28 ENCOUNTER — Ambulatory Visit: Payer: Self-pay | Admitting: Urology

## 2020-05-31 ENCOUNTER — Ambulatory Visit: Payer: Medicare HMO | Admitting: Urology

## 2020-05-31 ENCOUNTER — Encounter: Payer: Self-pay | Admitting: Urology

## 2020-06-10 ENCOUNTER — Other Ambulatory Visit: Payer: Self-pay | Admitting: Internal Medicine

## 2020-06-10 DIAGNOSIS — R053 Chronic cough: Secondary | ICD-10-CM

## 2020-06-10 DIAGNOSIS — Z8673 Personal history of transient ischemic attack (TIA), and cerebral infarction without residual deficits: Secondary | ICD-10-CM

## 2020-06-10 DIAGNOSIS — R1319 Other dysphagia: Secondary | ICD-10-CM

## 2020-06-20 ENCOUNTER — Other Ambulatory Visit: Payer: Self-pay

## 2020-06-20 ENCOUNTER — Ambulatory Visit
Admission: RE | Admit: 2020-06-20 | Discharge: 2020-06-20 | Disposition: A | Discharge status: Home / Self Care | Payer: Medicare HMO | Source: Ambulatory Visit | Attending: Internal Medicine | Admitting: Internal Medicine

## 2020-06-20 DIAGNOSIS — Z8673 Personal history of transient ischemic attack (TIA), and cerebral infarction without residual deficits: Secondary | ICD-10-CM | POA: Diagnosis not present

## 2020-06-20 DIAGNOSIS — R1310 Dysphagia, unspecified: Secondary | ICD-10-CM | POA: Diagnosis present

## 2020-06-20 DIAGNOSIS — R1319 Other dysphagia: Secondary | ICD-10-CM

## 2020-06-20 DIAGNOSIS — R05 Cough: Secondary | ICD-10-CM | POA: Insufficient documentation

## 2020-06-20 DIAGNOSIS — R053 Chronic cough: Secondary | ICD-10-CM

## 2020-07-21 ENCOUNTER — Emergency Department: Payer: Medicare HMO

## 2020-07-21 ENCOUNTER — Inpatient Hospital Stay
Admission: EM | Admit: 2020-07-21 | Discharge: 2020-07-29 | DRG: 871 | Disposition: A | Discharge status: Home / Self Care | Payer: Medicare HMO | Attending: Internal Medicine | Admitting: Internal Medicine

## 2020-07-21 ENCOUNTER — Encounter: Payer: Self-pay | Admitting: Radiology

## 2020-07-21 DIAGNOSIS — M1990 Unspecified osteoarthritis, unspecified site: Secondary | ICD-10-CM | POA: Diagnosis present

## 2020-07-21 DIAGNOSIS — Z8249 Family history of ischemic heart disease and other diseases of the circulatory system: Secondary | ICD-10-CM

## 2020-07-21 DIAGNOSIS — Z66 Do not resuscitate: Secondary | ICD-10-CM | POA: Diagnosis present

## 2020-07-21 DIAGNOSIS — J690 Pneumonitis due to inhalation of food and vomit: Secondary | ICD-10-CM | POA: Diagnosis present

## 2020-07-21 DIAGNOSIS — G309 Alzheimer's disease, unspecified: Secondary | ICD-10-CM | POA: Diagnosis present

## 2020-07-21 DIAGNOSIS — Z79899 Other long term (current) drug therapy: Secondary | ICD-10-CM

## 2020-07-21 DIAGNOSIS — E1122 Type 2 diabetes mellitus with diabetic chronic kidney disease: Secondary | ICD-10-CM | POA: Diagnosis present

## 2020-07-21 DIAGNOSIS — E870 Hyperosmolality and hypernatremia: Secondary | ICD-10-CM | POA: Diagnosis present

## 2020-07-21 DIAGNOSIS — A4151 Sepsis due to Escherichia coli [E. coli]: Secondary | ICD-10-CM | POA: Diagnosis not present

## 2020-07-21 DIAGNOSIS — D62 Acute posthemorrhagic anemia: Secondary | ICD-10-CM | POA: Diagnosis not present

## 2020-07-21 DIAGNOSIS — R6520 Severe sepsis without septic shock: Secondary | ICD-10-CM | POA: Diagnosis present

## 2020-07-21 DIAGNOSIS — E43 Unspecified severe protein-calorie malnutrition: Secondary | ICD-10-CM | POA: Diagnosis present

## 2020-07-21 DIAGNOSIS — E1140 Type 2 diabetes mellitus with diabetic neuropathy, unspecified: Secondary | ICD-10-CM | POA: Diagnosis present

## 2020-07-21 DIAGNOSIS — K209 Esophagitis, unspecified without bleeding: Secondary | ICD-10-CM | POA: Diagnosis present

## 2020-07-21 DIAGNOSIS — K250 Acute gastric ulcer with hemorrhage: Secondary | ICD-10-CM

## 2020-07-21 DIAGNOSIS — A419 Sepsis, unspecified organism: Secondary | ICD-10-CM | POA: Diagnosis present

## 2020-07-21 DIAGNOSIS — E119 Type 2 diabetes mellitus without complications: Secondary | ICD-10-CM

## 2020-07-21 DIAGNOSIS — J9811 Atelectasis: Secondary | ICD-10-CM | POA: Diagnosis present

## 2020-07-21 DIAGNOSIS — E860 Dehydration: Secondary | ICD-10-CM | POA: Diagnosis present

## 2020-07-21 DIAGNOSIS — R112 Nausea with vomiting, unspecified: Secondary | ICD-10-CM

## 2020-07-21 DIAGNOSIS — I152 Hypertension secondary to endocrine disorders: Secondary | ICD-10-CM | POA: Diagnosis present

## 2020-07-21 DIAGNOSIS — Z0189 Encounter for other specified special examinations: Secondary | ICD-10-CM

## 2020-07-21 DIAGNOSIS — L8990 Pressure ulcer of unspecified site, unspecified stage: Secondary | ICD-10-CM | POA: Insufficient documentation

## 2020-07-21 DIAGNOSIS — F0150 Vascular dementia without behavioral disturbance: Secondary | ICD-10-CM | POA: Diagnosis present

## 2020-07-21 DIAGNOSIS — I129 Hypertensive chronic kidney disease with stage 1 through stage 4 chronic kidney disease, or unspecified chronic kidney disease: Secondary | ICD-10-CM | POA: Diagnosis present

## 2020-07-21 DIAGNOSIS — K254 Chronic or unspecified gastric ulcer with hemorrhage: Secondary | ICD-10-CM | POA: Diagnosis present

## 2020-07-21 DIAGNOSIS — R4182 Altered mental status, unspecified: Secondary | ICD-10-CM | POA: Diagnosis not present

## 2020-07-21 DIAGNOSIS — J9601 Acute respiratory failure with hypoxia: Secondary | ICD-10-CM

## 2020-07-21 DIAGNOSIS — F0280 Dementia in other diseases classified elsewhere without behavioral disturbance: Secondary | ICD-10-CM | POA: Diagnosis present

## 2020-07-21 DIAGNOSIS — Z8673 Personal history of transient ischemic attack (TIA), and cerebral infarction without residual deficits: Secondary | ICD-10-CM

## 2020-07-21 DIAGNOSIS — K648 Other hemorrhoids: Secondary | ICD-10-CM | POA: Diagnosis present

## 2020-07-21 DIAGNOSIS — R634 Abnormal weight loss: Secondary | ICD-10-CM | POA: Diagnosis present

## 2020-07-21 DIAGNOSIS — K449 Diaphragmatic hernia without obstruction or gangrene: Secondary | ICD-10-CM | POA: Diagnosis present

## 2020-07-21 DIAGNOSIS — N1832 Chronic kidney disease, stage 3b: Secondary | ICD-10-CM | POA: Diagnosis present

## 2020-07-21 DIAGNOSIS — D649 Anemia, unspecified: Secondary | ICD-10-CM

## 2020-07-21 DIAGNOSIS — N179 Acute kidney failure, unspecified: Secondary | ICD-10-CM | POA: Diagnosis present

## 2020-07-21 DIAGNOSIS — K56609 Unspecified intestinal obstruction, unspecified as to partial versus complete obstruction: Secondary | ICD-10-CM | POA: Diagnosis present

## 2020-07-21 DIAGNOSIS — L89892 Pressure ulcer of other site, stage 2: Secondary | ICD-10-CM | POA: Diagnosis present

## 2020-07-21 DIAGNOSIS — N400 Enlarged prostate without lower urinary tract symptoms: Secondary | ICD-10-CM | POA: Diagnosis present

## 2020-07-21 DIAGNOSIS — E871 Hypo-osmolality and hyponatremia: Secondary | ICD-10-CM | POA: Diagnosis present

## 2020-07-21 DIAGNOSIS — B9620 Unspecified Escherichia coli [E. coli] as the cause of diseases classified elsewhere: Secondary | ICD-10-CM

## 2020-07-21 DIAGNOSIS — N390 Urinary tract infection, site not specified: Secondary | ICD-10-CM | POA: Diagnosis present

## 2020-07-21 DIAGNOSIS — Z85038 Personal history of other malignant neoplasm of large intestine: Secondary | ICD-10-CM

## 2020-07-21 DIAGNOSIS — R54 Age-related physical debility: Secondary | ICD-10-CM | POA: Diagnosis present

## 2020-07-21 DIAGNOSIS — Z20822 Contact with and (suspected) exposure to covid-19: Secondary | ICD-10-CM | POA: Diagnosis present

## 2020-07-21 DIAGNOSIS — E1159 Type 2 diabetes mellitus with other circulatory complications: Secondary | ICD-10-CM | POA: Diagnosis present

## 2020-07-21 DIAGNOSIS — E876 Hypokalemia: Secondary | ICD-10-CM | POA: Diagnosis present

## 2020-07-21 DIAGNOSIS — E1169 Type 2 diabetes mellitus with other specified complication: Secondary | ICD-10-CM | POA: Diagnosis present

## 2020-07-21 DIAGNOSIS — Z833 Family history of diabetes mellitus: Secondary | ICD-10-CM

## 2020-07-21 DIAGNOSIS — J9621 Acute and chronic respiratory failure with hypoxia: Secondary | ICD-10-CM | POA: Diagnosis present

## 2020-07-21 HISTORY — DX: Type 2 diabetes mellitus without complications: E11.9

## 2020-07-21 HISTORY — DX: Type 2 diabetes mellitus with other circulatory complications: E11.59

## 2020-07-21 HISTORY — DX: Hypertension secondary to endocrine disorders: I15.2

## 2020-07-21 HISTORY — DX: Dementia in other diseases classified elsewhere, unspecified severity, without behavioral disturbance, psychotic disturbance, mood disturbance, and anxiety: F02.80

## 2020-07-21 HISTORY — DX: Vascular dementia, unspecified severity, without behavioral disturbance, psychotic disturbance, mood disturbance, and anxiety: F01.50

## 2020-07-21 LAB — URINALYSIS, COMPLETE (UACMP) WITH MICROSCOPIC
Bilirubin Urine: NEGATIVE
Glucose, UA: NEGATIVE mg/dL
Ketones, ur: NEGATIVE mg/dL
Nitrite: NEGATIVE
Protein, ur: 30 mg/dL — AB
Specific Gravity, Urine: 1.018 (ref 1.005–1.030)
WBC, UA: >50 WBC/hpf — ABNORMAL HIGH (ref 0–5)
pH: 5 (ref 5.0–8.0)

## 2020-07-21 LAB — BRAIN NATRIURETIC PEPTIDE: B Natriuretic Peptide: 103.4 pg/mL — ABNORMAL HIGH (ref 0.0–100.0)

## 2020-07-21 LAB — CBC WITH DIFFERENTIAL/PLATELET
Abs Immature Granulocytes: 0.08 10*3/uL — ABNORMAL HIGH (ref 0.00–0.07)
Basophils Absolute: 0.1 10*3/uL (ref 0.0–0.1)
Basophils Relative: 0 %
Eosinophils Absolute: 0 10*3/uL (ref 0.0–0.5)
Eosinophils Relative: 0 %
HCT: 35.3 % — ABNORMAL LOW (ref 39.0–52.0)
Hemoglobin: 11.4 g/dL — ABNORMAL LOW (ref 13.0–17.0)
Immature Granulocytes: 1 %
Lymphocytes Relative: 9 %
Lymphs Abs: 1 10*3/uL (ref 0.7–4.0)
MCH: 23.7 pg — ABNORMAL LOW (ref 26.0–34.0)
MCHC: 32.3 g/dL (ref 30.0–36.0)
MCV: 73.4 fL — ABNORMAL LOW (ref 80.0–100.0)
Monocytes Absolute: 1 10*3/uL (ref 0.1–1.0)
Monocytes Relative: 9 %
Neutro Abs: 9.4 10*3/uL — ABNORMAL HIGH (ref 1.7–7.7)
Neutrophils Relative %: 81 %
Platelets: 279 10*3/uL (ref 150–400)
RBC: 4.81 MIL/uL (ref 4.22–5.81)
RDW: 17.9 % — ABNORMAL HIGH (ref 11.5–15.5)
WBC Morphology: INCREASED
WBC: 11.5 10*3/uL — ABNORMAL HIGH (ref 4.0–10.5)
nRBC: 0 % (ref 0.0–0.2)

## 2020-07-21 LAB — RESPIRATORY PANEL BY RT PCR (FLU A&B, COVID)
Influenza A by PCR: NEGATIVE
Influenza B by PCR: NEGATIVE
SARS Coronavirus 2 by RT PCR: NEGATIVE

## 2020-07-21 LAB — TROPONIN I (HIGH SENSITIVITY)
Troponin I (High Sensitivity): 24 ng/L — ABNORMAL HIGH (ref <18)
Troponin I (High Sensitivity): 31 ng/L — ABNORMAL HIGH (ref <18)

## 2020-07-21 MED ORDER — SODIUM CHLORIDE 0.9 % IV SOLN
500.0000 mg | Freq: Once | INTRAVENOUS | Status: AC
  Administered 2020-07-21: 500 mg via INTRAVENOUS
  Filled 2020-07-21: qty 500

## 2020-07-21 MED ORDER — SODIUM CHLORIDE 0.9 % IV SOLN
1.0000 g | Freq: Once | INTRAVENOUS | Status: AC
  Administered 2020-07-21: 1 g via INTRAVENOUS
  Filled 2020-07-21: qty 10

## 2020-07-21 MED ORDER — LACTATED RINGERS IV BOLUS
1000.0000 mL | Freq: Once | INTRAVENOUS | Status: AC
  Administered 2020-07-21: 1000 mL via INTRAVENOUS
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
## 2020-07-21 NOTE — ED Provider Notes (Signed)
[facility] Emergency Department Provider Note   ____________________________________________   First MD Initiated Contact with Patient 07/21/20 2040     (approximate)  I have reviewed the triage vital signs and the nursing notes.   HISTORY  Chief Complaint Altered Mental Status   HPI Elijah Dominguez is a 84 y.o. male with past medical history of hypertension, CKD, and dementia who presents to the ED for altered mental status.  History is limited due to patient's baseline dementia and worsening confusion.  Per EMS, family noticed that patient has been vomiting for the past 2 days and has also been less alert than usual.  Patient does admit to recent vomiting and diarrhea, but denies any abdominal pain.  Speaking with his daughter over the phone, he has had some chills, but has not had any fevers, cough, chest pain, or shortness of breath.  She states that "he always breathes hard", but his breathing has not been any worse than usual.  He has not had any sick contacts, but he has not been vaccinated for COVID-19.        Past Medical History:  Diagnosis Date  . Arthritis   . Hypertension associated with diabetes (HCC)   . Mixed Alzheimer's and vascular dementia (HCC)   . Neuropathy   . Type 2 diabetes mellitus [facility])     Patient Active Problem List   Diagnosis Date Noted  . Sepsis (HCC) 07/21/2020  . Type 2 diabetes mellitus (HCC)   . Hypertension associated with diabetes (HCC)   . Mixed Alzheimer's and vascular dementia (HCC)   . HTN (hypertension) 03/19/2019  . Acute encephalopathy 03/19/2019  . CKD (chronic kidney disease), stage III (HCC) 03/19/2019  . UTI (urinary tract infection) 03/19/2019  . Acute on chronic renal failure (HCC) 10/01/2016  . Urinary tract infection 10/01/2016  . Acute urinary retention 10/01/2016  . Generalized weakness 10/01/2016  . Leukocytosis 10/01/2016  . Diarrhea 10/01/2016  . Left shoulder pain 10/01/2016    Past  Surgical History:  Procedure Laterality Date  . THULIUM LASER TURP (TRANSURETHRAL RESECTION OF PROSTATE) N/A 11/30/2016   Procedure: THULIUM LASER TURP (TRANSURETHRAL RESECTION OF PROSTATE);  Surgeon: Vanna Scotland, MD;  Location: [facility] ORS;  Service: Urology;  Laterality: N/A;    Prior to Admission medications   Medication Sig Start Date End Date Taking? Authorizing Provider  gabapentin (NEURONTIN) 100 MG capsule Take 100 mg by mouth 2 (two) times daily. 05/24/20  Yes [provider]  lisinopril (ZESTRIL) 5 MG tablet Take 5 mg by mouth daily. 11/22/19  Yes [provider]  memantine (NAMENDA) 5 MG tablet Take 5 mg by mouth 2 (two) times daily. 12/27/18  Yes [provider]  vitamin B-12 (CYANOCOBALAMIN) 1000 MCG tablet Take 1,000 mcg by mouth daily. 12/30/18  Yes [provider]    Allergies Patient has no known allergies.  Family History  Problem Relation Age of Onset  . Diabetes Mother   . Heart attack Father   . Prostate cancer Neg Hx   . Bladder Cancer Neg Hx   . Kidney cancer Neg Hx     Social History Social History   Tobacco Use  . Smoking status: Never Smoker  . Smokeless tobacco: Never Used  Substance Use Topics  . Alcohol use: No  . Drug use: No    Review of Systems  Constitutional: No fever/chills Eyes: No visual changes. ENT: No sore throat. Cardiovascular: Denies chest pain. Respiratory: Denies shortness of breath. Gastrointestinal:  No abdominal pain.  Positive for nausea, vomiting, and diarrhea.  No constipation. Genitourinary: Negative for dysuria. Musculoskeletal: Negative for back pain. Skin: Negative for rash. Neurological: Negative for headaches, focal weakness or numbness.  ____________________________________________   PHYSICAL EXAM:  VITAL SIGNS: ED Triage Vitals  Enc Vitals Group     BP      Pulse      Resp      Temp      Temp src      SpO2      Weight      Height      Head Circumference      Peak  Flow      Pain Score      Pain Loc      Pain Edu?      Excl. in GC?     Constitutional: Alert and oriented to person, but not place or time. Eyes: Conjunctivae are normal. Head: Atraumatic. Nose: No congestion/rhinnorhea. Mouth/Throat: Mucous membranes are dry. Neck: Normal ROM Cardiovascular: Normal rate, regular rhythm. Grossly normal heart sounds. Respiratory: Normal respiratory effort.  No retractions. Lungs CTAB. Gastrointestinal: Soft and nontender. No distention. Genitourinary: deferred Musculoskeletal: No lower extremity tenderness nor edema. Neurologic:  Normal speech and language.  Globally weak but no gross focal neurologic deficits are appreciated. Skin:  Skin is warm, dry and intact. No rash noted. Psychiatric: Mood and affect are normal. Speech and behavior are normal.  ____________________________________________   LABS (all labs ordered are listed, but only abnormal results are displayed)  Labs Reviewed  CBC WITH DIFFERENTIAL/PLATELET - Abnormal; Notable for the following components:      Result Value   WBC 11.5 (*)    Hemoglobin 11.4 (*)    HCT 35.3 (*)    MCV 73.4 (*)    MCH 23.7 (*)    RDW 17.9 (*)    Neutro Abs 9.4 (*)    Abs Immature Granulocytes 0.08 (*)    All other components within normal limits  URINALYSIS, COMPLETE (UACMP) WITH MICROSCOPIC - Abnormal; Notable for the following components:   Color, Urine AMBER (*)    APPearance CLOUDY (*)    Hgb urine dipstick SMALL (*)    Protein, ur 30 (*)    Leukocytes,Ua MODERATE (*)    WBC, UA >50 (*)    Bacteria, UA MANY (*)    All other components within normal limits  BRAIN NATRIURETIC PEPTIDE - Abnormal; Notable for the following components:   B Natriuretic Peptide 103.4 (*)    All other components within normal limits  TROPONIN I (HIGH SENSITIVITY) - Abnormal; Notable for the following components:   Troponin I (High Sensitivity) 24 (*)    All other components within normal limits  RESPIRATORY  PANEL BY RT PCR (FLU A&B, COVID)  URINE CULTURE  COMPREHENSIVE METABOLIC PANEL  LIPASE, BLOOD  TROPONIN I (HIGH SENSITIVITY)   ____________________________________________  EKG  ED ECG REPORT I, Chesley Noon, the attending physician, personally viewed and interpreted this ECG.   Date: 07/21/2020  EKG Time: 20:44  Rate: 96  Rhythm: normal sinus rhythm  Axis: Normal  Intervals:Incomplete LBBB  ST&T Change: None   PROCEDURES  Procedure(s) performed (including Critical Care):  .Critical Care Performed by: Chesley Noon, MD Authorized by: Chesley Noon, MD   Critical care provider statement:    Critical care time (minutes):  45   Critical care time was exclusive of:  Separately billable procedures and treating other patients and teaching time   Critical care was  necessary to treat or prevent imminent or life-threatening deterioration of the following conditions:  Respiratory failure   Critical care was time spent personally by me on the following activities:  Discussions with consultants, evaluation of patient's response to treatment, examination of patient, ordering and performing treatments and interventions, ordering and review of laboratory studies, ordering and review of radiographic studies, pulse oximetry, re-evaluation of patient's condition, obtaining history from patient or surrogate and review of old charts   I assumed direction of critical care for this patient from another provider in my specialty: no       ____________________________________________   INITIAL IMPRESSION / ASSESSMENT AND PLAN / ED COURSE       84 year old male with past medical history of hypertension, CKD, and dementia who presents to the ED for vomiting with decreased alertness over the past 2 days.  Patient is awake and alert on arrival, but only oriented to self.  He appears globally weak but no focal deficits noted.  O2 sats also noted to be in the mid 80s on room air, patient  subsequently placed on 4 L nasal cannula with improvement.  I would be suspicious for COVID-19 given he is unvaccinated with recent GI symptoms.  We will check chest x-ray as well as CT head given his change in mental status.  I am concerned that he could also be dehydrated and we will hydrate with IV fluids.  No abdominal tenderness noted on exam.  EKG shows no evidence of arrhythmia or ischemia but given his shortness of breath we will check troponin and BNP.  CT head is negative for acute process, chest x-ray reviewed by me and does show infiltrate consistent with pneumonia.  This likely explains his hypoxic respiratory failure given there are no signs of CHF or COPD.  Work-up also reveals UTI and we will treat patient with Rocephin and azithromycin.  COVID-19 testing is negative.  Case discussed with hospitalist for admission.      ____________________________________________   FINAL CLINICAL IMPRESSION(S) / ED DIAGNOSES  Final diagnoses:  Altered mental status, unspecified altered mental status type  Acute respiratory failure with hypoxia (HCC)  Lower urinary tract infectious disease     ED Discharge Orders    None       Note:  This document was prepared using Dragon voice recognition software and may include unintentional dictation errors.   Chesley Noon, MD 07/21/20 [phone]

## 2020-07-21 NOTE — ED Triage Notes (Signed)
EMS endorses pt family called ems for AMS increasing from baseline. Pt dementia at baseline. Family endorsing nausea and diarrhea

## 2020-07-22 ENCOUNTER — Inpatient Hospital Stay: Payer: Medicare HMO

## 2020-07-22 ENCOUNTER — Observation Stay: Payer: Medicare HMO

## 2020-07-22 ENCOUNTER — Encounter: Payer: Self-pay | Admitting: Internal Medicine

## 2020-07-22 DIAGNOSIS — A419 Sepsis, unspecified organism: Secondary | ICD-10-CM | POA: Diagnosis not present

## 2020-07-22 DIAGNOSIS — N390 Urinary tract infection, site not specified: Secondary | ICD-10-CM | POA: Diagnosis present

## 2020-07-22 DIAGNOSIS — K56609 Unspecified intestinal obstruction, unspecified as to partial versus complete obstruction: Secondary | ICD-10-CM | POA: Diagnosis present

## 2020-07-22 DIAGNOSIS — D649 Anemia, unspecified: Secondary | ICD-10-CM | POA: Diagnosis not present

## 2020-07-22 DIAGNOSIS — Z20822 Contact with and (suspected) exposure to covid-19: Secondary | ICD-10-CM | POA: Diagnosis present

## 2020-07-22 DIAGNOSIS — Z85038 Personal history of other malignant neoplasm of large intestine: Secondary | ICD-10-CM | POA: Diagnosis not present

## 2020-07-22 DIAGNOSIS — J9811 Atelectasis: Secondary | ICD-10-CM | POA: Diagnosis present

## 2020-07-22 DIAGNOSIS — L8990 Pressure ulcer of unspecified site, unspecified stage: Secondary | ICD-10-CM | POA: Insufficient documentation

## 2020-07-22 DIAGNOSIS — N1832 Chronic kidney disease, stage 3b: Secondary | ICD-10-CM | POA: Diagnosis present

## 2020-07-22 DIAGNOSIS — N179 Acute kidney failure, unspecified: Secondary | ICD-10-CM | POA: Diagnosis not present

## 2020-07-22 DIAGNOSIS — K253 Acute gastric ulcer without hemorrhage or perforation: Secondary | ICD-10-CM | POA: Diagnosis not present

## 2020-07-22 DIAGNOSIS — J690 Pneumonitis due to inhalation of food and vomit: Secondary | ICD-10-CM | POA: Diagnosis present

## 2020-07-22 DIAGNOSIS — I129 Hypertensive chronic kidney disease with stage 1 through stage 4 chronic kidney disease, or unspecified chronic kidney disease: Secondary | ICD-10-CM | POA: Diagnosis present

## 2020-07-22 DIAGNOSIS — N400 Enlarged prostate without lower urinary tract symptoms: Secondary | ICD-10-CM | POA: Diagnosis present

## 2020-07-22 DIAGNOSIS — G309 Alzheimer's disease, unspecified: Secondary | ICD-10-CM | POA: Diagnosis present

## 2020-07-22 DIAGNOSIS — Z8673 Personal history of transient ischemic attack (TIA), and cerebral infarction without residual deficits: Secondary | ICD-10-CM | POA: Diagnosis not present

## 2020-07-22 DIAGNOSIS — E43 Unspecified severe protein-calorie malnutrition: Secondary | ICD-10-CM | POA: Diagnosis present

## 2020-07-22 DIAGNOSIS — K254 Chronic or unspecified gastric ulcer with hemorrhage: Secondary | ICD-10-CM | POA: Diagnosis present

## 2020-07-22 DIAGNOSIS — J9621 Acute and chronic respiratory failure with hypoxia: Secondary | ICD-10-CM | POA: Diagnosis present

## 2020-07-22 DIAGNOSIS — F0150 Vascular dementia without behavioral disturbance: Secondary | ICD-10-CM | POA: Diagnosis present

## 2020-07-22 DIAGNOSIS — A4151 Sepsis due to Escherichia coli [E. coli]: Secondary | ICD-10-CM | POA: Diagnosis present

## 2020-07-22 DIAGNOSIS — D62 Acute posthemorrhagic anemia: Secondary | ICD-10-CM | POA: Diagnosis not present

## 2020-07-22 DIAGNOSIS — E1122 Type 2 diabetes mellitus with diabetic chronic kidney disease: Secondary | ICD-10-CM | POA: Diagnosis present

## 2020-07-22 DIAGNOSIS — E1169 Type 2 diabetes mellitus with other specified complication: Secondary | ICD-10-CM | POA: Diagnosis present

## 2020-07-22 DIAGNOSIS — R4182 Altered mental status, unspecified: Secondary | ICD-10-CM | POA: Diagnosis present

## 2020-07-22 DIAGNOSIS — K250 Acute gastric ulcer with hemorrhage: Secondary | ICD-10-CM | POA: Diagnosis not present

## 2020-07-22 DIAGNOSIS — E1140 Type 2 diabetes mellitus with diabetic neuropathy, unspecified: Secondary | ICD-10-CM | POA: Diagnosis present

## 2020-07-22 DIAGNOSIS — E870 Hyperosmolality and hypernatremia: Secondary | ICD-10-CM | POA: Diagnosis present

## 2020-07-22 DIAGNOSIS — R6520 Severe sepsis without septic shock: Secondary | ICD-10-CM | POA: Diagnosis present

## 2020-07-22 DIAGNOSIS — E871 Hypo-osmolality and hyponatremia: Secondary | ICD-10-CM | POA: Diagnosis present

## 2020-07-22 DIAGNOSIS — F0280 Dementia in other diseases classified elsewhere without behavioral disturbance: Secondary | ICD-10-CM | POA: Diagnosis present

## 2020-07-22 DIAGNOSIS — Z66 Do not resuscitate: Secondary | ICD-10-CM | POA: Diagnosis present

## 2020-07-22 DIAGNOSIS — K921 Melena: Secondary | ICD-10-CM | POA: Diagnosis not present

## 2020-07-22 LAB — BASIC METABOLIC PANEL
Anion gap: 13 (ref 5–15)
BUN: 61 mg/dL — ABNORMAL HIGH (ref 8–23)
CO2: 26 mmol/L (ref 22–32)
Calcium: 8.2 mg/dL — ABNORMAL LOW (ref 8.9–10.3)
Chloride: 103 mmol/L (ref 98–111)
Creatinine, Ser: 1.87 mg/dL — ABNORMAL HIGH (ref 0.61–1.24)
GFR, Estimated: 35 mL/min — ABNORMAL LOW (ref >60)
Glucose, Bld: 179 mg/dL — ABNORMAL HIGH (ref 70–99)
Potassium: 4.1 mmol/L (ref 3.5–5.1)
Sodium: 142 mmol/L (ref 135–145)

## 2020-07-22 LAB — COMPREHENSIVE METABOLIC PANEL
ALT: 14 U/L (ref 0–44)
AST: 33 U/L (ref 15–41)
Albumin: 2.7 g/dL — ABNORMAL LOW (ref 3.5–5.0)
Alkaline Phosphatase: 53 U/L (ref 38–126)
Anion gap: 17 — ABNORMAL HIGH (ref 5–15)
BUN: 50 mg/dL — ABNORMAL HIGH (ref 8–23)
CO2: 24 mmol/L (ref 22–32)
Calcium: 8.3 mg/dL — ABNORMAL LOW (ref 8.9–10.3)
Chloride: 103 mmol/L (ref 98–111)
Creatinine, Ser: 1.68 mg/dL — ABNORMAL HIGH (ref 0.61–1.24)
GFR, Estimated: 40 mL/min — ABNORMAL LOW (ref >60)
Glucose, Bld: 175 mg/dL — ABNORMAL HIGH (ref 70–99)
Potassium: 4.2 mmol/L (ref 3.5–5.1)
Sodium: 144 mmol/L (ref 135–145)
Total Bilirubin: 1.1 mg/dL (ref 0.3–1.2)
Total Protein: 6.3 g/dL — ABNORMAL LOW (ref 6.5–8.1)

## 2020-07-22 LAB — CBC
HCT: 27.9 % — ABNORMAL LOW (ref 39.0–52.0)
Hemoglobin: 9.3 g/dL — ABNORMAL LOW (ref 13.0–17.0)
MCH: 23.3 pg — ABNORMAL LOW (ref 26.0–34.0)
MCHC: 33.3 g/dL (ref 30.0–36.0)
MCV: 69.9 fL — ABNORMAL LOW (ref 80.0–100.0)
Platelets: 267 10*3/uL (ref 150–400)
RBC: 3.99 MIL/uL — ABNORMAL LOW (ref 4.22–5.81)
RDW: 16.8 % — ABNORMAL HIGH (ref 11.5–15.5)
WBC: 10.5 10*3/uL (ref 4.0–10.5)
nRBC: 0 % (ref 0.0–0.2)

## 2020-07-22 LAB — MRSA PCR SCREENING: MRSA by PCR: POSITIVE — AB

## 2020-07-22 LAB — PROCALCITONIN: Procalcitonin: 33.44 ng/mL

## 2020-07-22 LAB — GLUCOSE, CAPILLARY
Glucose-Capillary: 144 mg/dL — ABNORMAL HIGH (ref 70–99)
Glucose-Capillary: 122 mg/dL — ABNORMAL HIGH (ref 70–99)

## 2020-07-22 LAB — LIPASE, BLOOD: Lipase: 20 U/L (ref 11–51)

## 2020-07-22 MED ORDER — SODIUM CHLORIDE 0.9 % IV SOLN
1.0000 g | INTRAVENOUS | Status: DC
  Filled 2020-07-22: qty 10

## 2020-07-22 MED ORDER — LACTATED RINGERS IV SOLN: INTRAVENOUS | Status: DC

## 2020-07-22 MED ORDER — VANCOMYCIN HCL IN DEXTROSE 1-5 GM/200ML-% IV SOLN
1000.0000 mg | INTRAVENOUS | Status: DC
  Administered 2020-07-23: 15:00:00 1000 mg via INTRAVENOUS
  Filled 2020-07-22 (×2): qty 200

## 2020-07-22 MED ORDER — CHLORHEXIDINE GLUCONATE CLOTH 2 % EX PADS: 6.0000 | MEDICATED_PAD | Freq: Every day | CUTANEOUS | Status: DC

## 2020-07-22 MED ORDER — MEMANTINE HCL 5 MG PO TABS: 5.0000 mg | ORAL_TABLET | Freq: Two times a day (BID) | ORAL | Status: DC

## 2020-07-22 MED ORDER — SODIUM CHLORIDE 0.9 % IV SOLN
500.0000 mg | INTRAVENOUS | Status: DC
  Filled 2020-07-22: qty 500

## 2020-07-22 MED ORDER — ONDANSETRON HCL 4 MG/2ML IJ SOLN: 4.0000 mg | Freq: Four times a day (QID) | INTRAMUSCULAR | Status: DC | PRN

## 2020-07-22 MED ORDER — SODIUM CHLORIDE 0.9 % IV SOLN
3.0000 g | Freq: Once | INTRAVENOUS | Status: AC
  Administered 2020-07-22: 15:00:00 3 g via INTRAVENOUS
  Filled 2020-07-22: qty 3

## 2020-07-22 MED ORDER — ACETAMINOPHEN 650 MG RE SUPP: 650.0000 mg | Freq: Four times a day (QID) | RECTAL | Status: DC | PRN

## 2020-07-22 MED ORDER — DAKINS (1/4 STRENGTH) 0.125 % EX SOLN
Freq: Every day | CUTANEOUS | Status: AC
  Filled 2020-07-22: qty 473

## 2020-07-22 MED ORDER — VANCOMYCIN HCL 1500 MG/300ML IV SOLN
1500.0000 mg | Freq: Once | INTRAVENOUS | Status: AC
  Administered 2020-07-22: 1500 mg via INTRAVENOUS
  Filled 2020-07-22: qty 300

## 2020-07-22 MED ORDER — MUPIROCIN 2 % EX OINT
1.0000 "application " | TOPICAL_OINTMENT | Freq: Two times a day (BID) | CUTANEOUS | Status: AC
  Administered 2020-07-23 – 2020-07-27 (×10): 1 via NASAL
  Filled 2020-07-22 (×2): qty 22

## 2020-07-22 MED ORDER — HEPARIN SODIUM (PORCINE) 5000 UNIT/ML IJ SOLN
5000.0000 [IU] | Freq: Three times a day (TID) | INTRAMUSCULAR | Status: DC
  Administered 2020-07-22 – 2020-07-23 (×3): 5000 [IU] via SUBCUTANEOUS
  Filled 2020-07-22 (×3): qty 1

## 2020-07-22 MED ORDER — SODIUM CHLORIDE 0.9 % IV SOLN: INTRAVENOUS | Status: DC

## 2020-07-22 MED ORDER — SODIUM CHLORIDE 0.9 % IV SOLN
3.0000 g | Freq: Four times a day (QID) | INTRAVENOUS | Status: DC
  Administered 2020-07-22 – 2020-07-25 (×13): 3 g via INTRAVENOUS
  Filled 2020-07-22 (×4): qty 8
  Filled 2020-07-22: qty 3
  Filled 2020-07-22 (×8): qty 8
  Filled 2020-07-22: qty 3
  Filled 2020-07-22: qty 0.75
  Filled 2020-07-22: qty 8

## 2020-07-22 MED ORDER — ACETAMINOPHEN 325 MG PO TABS: 650.0000 mg | ORAL_TABLET | Freq: Four times a day (QID) | ORAL | Status: DC | PRN

## 2020-07-22 MED ORDER — ONDANSETRON HCL 4 MG PO TABS: 4.0000 mg | ORAL_TABLET | Freq: Four times a day (QID) | ORAL | Status: DC | PRN

## 2020-07-22 NOTE — ED Notes (Signed)
PT partially removed NG tube. Reinserted and ordered chest x

## 2020-07-22 NOTE — ED Notes (Signed)
Report to ashley RN.

## 2020-07-22 NOTE — Progress Notes (Signed)
Pt has removed his NG tube; on call provider, [place], NP notified.

## 2020-07-22 NOTE — ED Notes (Signed)
This RN attempted 2 times for bloodwork but unsuccessful.

## 2020-07-22 NOTE — Progress Notes (Signed)
Pharmacy Antibiotic Note  Elijah Dominguez is a 84 y.o. male admitted on 07/21/2020 with pneumonia/aspiration PNA  Pharmacy has been consulted for Unasyn and Vancomycin dosing. -received CTX/Azith x 1 each in ED  Plan: Vancomycin 1500 mg IV x 1 loading dose (~20 mg/kg) then Vancomycin 1000 mg IV every 24 hours.  Goal trough 15-20 mcg/mL. based on Vancomycin nomogram Wt 71.2kg  Crcl 30.7 ml/min MRSA PCR ordered. F/u renal fxn  -Unasyn 3gm IV q6h   Height: 5\' 11"  (180.3 cm) Weight: 71.2 kg (157 lb) IBW/kg (Calculated) : 75.3  Temp (24hrs), Avg:98.6 F (37 C), Min:98.1 F (36.7 C), Max:99.4 F (37.4 C)  Recent Labs  Lab 07/21/20 2048 07/21/20 2311 07/22/20 1102  WBC 11.5*  --  10.5  CREATININE  --  1.68* 1.87*    Estimated Creatinine Clearance: 30.7 mL/min (A) (by C-G formula based on SCr of 1.87 mg/dL (H)).    No Known Allergies  Antimicrobials this admission: CTX/Azith x 1 dose each Vanc 11/1 >> Unasyn 11/1 >>  Dose adjustments this admission:    Microbiology results: 11/1 BCx: pending 10/31 UCx: pending    Sputum:    11/1 MRSA PCR: pending  Thank you for allowing pharmacy to be a part of this patient's care.  Elijah Dominguez A 07/22/2020 1:12 PM
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
## 2020-07-23 ENCOUNTER — Inpatient Hospital Stay: Payer: Medicare HMO

## 2020-07-23 DIAGNOSIS — R6520 Severe sepsis without septic shock: Secondary | ICD-10-CM | POA: Diagnosis not present

## 2020-07-23 DIAGNOSIS — A419 Sepsis, unspecified organism: Secondary | ICD-10-CM | POA: Diagnosis not present

## 2020-07-23 LAB — BASIC METABOLIC PANEL
Anion gap: 10 (ref 5–15)
BUN: 52 mg/dL — ABNORMAL HIGH (ref 8–23)
CO2: 25 mmol/L (ref 22–32)
Calcium: 8.1 mg/dL — ABNORMAL LOW (ref 8.9–10.3)
Chloride: 111 mmol/L (ref 98–111)
Creatinine, Ser: 1.5 mg/dL — ABNORMAL HIGH (ref 0.61–1.24)
GFR, Estimated: 46 mL/min — ABNORMAL LOW (ref >60)
Glucose, Bld: 87 mg/dL (ref 70–99)
Potassium: 3.5 mmol/L (ref 3.5–5.1)
Sodium: 146 mmol/L — ABNORMAL HIGH (ref 135–145)

## 2020-07-23 LAB — HEMOGLOBIN AND HEMATOCRIT, BLOOD
HCT: 27.8 % — ABNORMAL LOW (ref 39.0–52.0)
Hemoglobin: 9.1 g/dL — ABNORMAL LOW (ref 13.0–17.0)

## 2020-07-23 LAB — URINE CULTURE: Culture: >=100000 — AB

## 2020-07-23 LAB — GLUCOSE, CAPILLARY
Glucose-Capillary: 81 mg/dL (ref 70–99)
Glucose-Capillary: 131 mg/dL — ABNORMAL HIGH (ref 70–99)

## 2020-07-23 NOTE — Progress Notes (Signed)
MD made aware patient self removed NG tube despite use of bilat hand mitts. Will leave NG tube out for now.

## 2020-07-23 NOTE — Progress Notes (Signed)
Provider ordered to reinsert NGT at 2330; after abd view provider ordered to advance ngt by 10 cm.  NGT advanced to 60 cm and checked for placement via auscultation and then placed on ILWS.  Pt continues to have a small amt of dark red drainage from NGT.
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
## 2020-07-24 ENCOUNTER — Inpatient Hospital Stay: Payer: Medicare HMO

## 2020-07-24 DIAGNOSIS — A419 Sepsis, unspecified organism: Secondary | ICD-10-CM

## 2020-07-24 DIAGNOSIS — J690 Pneumonitis due to inhalation of food and vomit: Secondary | ICD-10-CM | POA: Diagnosis not present

## 2020-07-24 DIAGNOSIS — N179 Acute kidney failure, unspecified: Secondary | ICD-10-CM | POA: Diagnosis not present

## 2020-07-24 DIAGNOSIS — G309 Alzheimer's disease, unspecified: Secondary | ICD-10-CM | POA: Diagnosis not present

## 2020-07-24 DIAGNOSIS — F0150 Vascular dementia without behavioral disturbance: Secondary | ICD-10-CM

## 2020-07-24 DIAGNOSIS — F0280 Dementia in other diseases classified elsewhere without behavioral disturbance: Secondary | ICD-10-CM

## 2020-07-24 DIAGNOSIS — R6520 Severe sepsis without septic shock: Secondary | ICD-10-CM

## 2020-07-24 LAB — CBC WITH DIFFERENTIAL/PLATELET
Abs Immature Granulocytes: 0.17 10*3/uL — ABNORMAL HIGH (ref 0.00–0.07)
Basophils Absolute: 0 10*3/uL (ref 0.0–0.1)
Basophils Relative: 0 %
Eosinophils Absolute: 0 10*3/uL (ref 0.0–0.5)
Eosinophils Relative: 0 %
HCT: 23.1 % — ABNORMAL LOW (ref 39.0–52.0)
Hemoglobin: 8 g/dL — ABNORMAL LOW (ref 13.0–17.0)
Immature Granulocytes: 1 %
Lymphocytes Relative: 5 %
Lymphs Abs: 0.7 10*3/uL (ref 0.7–4.0)
MCH: 24.2 pg — ABNORMAL LOW (ref 26.0–34.0)
MCHC: 34.6 g/dL (ref 30.0–36.0)
MCV: 70 fL — ABNORMAL LOW (ref 80.0–100.0)
Monocytes Absolute: 0.9 10*3/uL (ref 0.1–1.0)
Monocytes Relative: 6 %
Neutro Abs: 12.8 10*3/uL — ABNORMAL HIGH (ref 1.7–7.7)
Neutrophils Relative %: 88 %
Platelets: 227 10*3/uL (ref 150–400)
RBC: 3.3 MIL/uL — ABNORMAL LOW (ref 4.22–5.81)
RDW: 17 % — ABNORMAL HIGH (ref 11.5–15.5)
Smear Review: NORMAL
WBC: 14.6 10*3/uL — ABNORMAL HIGH (ref 4.0–10.5)
nRBC: 0 % (ref 0.0–0.2)

## 2020-07-24 LAB — BASIC METABOLIC PANEL
Anion gap: 11 (ref 5–15)
BUN: 54 mg/dL — ABNORMAL HIGH (ref 8–23)
CO2: 24 mmol/L (ref 22–32)
Calcium: 8.3 mg/dL — ABNORMAL LOW (ref 8.9–10.3)
Chloride: 119 mmol/L — ABNORMAL HIGH (ref 98–111)
Creatinine, Ser: 1.25 mg/dL — ABNORMAL HIGH (ref 0.61–1.24)
GFR, Estimated: 57 mL/min — ABNORMAL LOW (ref >60)
Glucose, Bld: 138 mg/dL — ABNORMAL HIGH (ref 70–99)
Potassium: 3.2 mmol/L — ABNORMAL LOW (ref 3.5–5.1)
Sodium: 154 mmol/L — ABNORMAL HIGH (ref 135–145)

## 2020-07-24 LAB — MAGNESIUM: Magnesium: 2.4 mg/dL (ref 1.7–2.4)

## 2020-07-24 MED ORDER — POTASSIUM CL IN DEXTROSE 5% 20 MEQ/L IV SOLN
20.0000 meq | INTRAVENOUS | Status: DC
  Administered 2020-07-24: 20 meq via INTRAVENOUS
  Filled 2020-07-24 (×2): qty 1000

## 2020-07-24 MED ORDER — VANCOMYCIN HCL 1500 MG/300ML IV SOLN
1500.0000 mg | INTRAVENOUS | Status: DC
  Administered 2020-07-24: 12:00:00 1500 mg via INTRAVENOUS
  Filled 2020-07-24: qty 300

## 2020-07-24 MED ORDER — POTASSIUM CHLORIDE 20 MEQ PO PACK
40.0000 meq | PACK | Freq: Once | ORAL | Status: AC
  Administered 2020-07-24: 40 meq via ORAL
  Filled 2020-07-24: qty 2

## 2020-07-24 MED ORDER — SENNOSIDES-DOCUSATE SODIUM 8.6-50 MG PO TABS
2.0000 | ORAL_TABLET | Freq: Two times a day (BID) | ORAL | Status: DC
  Administered 2020-07-24 – 2020-07-26 (×4): 2 via ORAL
  Filled 2020-07-24 (×4): qty 2

## 2020-07-24 NOTE — Progress Notes (Cosign Needed)
    Durable Medical Equipment  (From admission, onward)         Start     Ordered   07/24/20 1235  For home use only DME Hospital bed  Once       Question Answer Comment  Length of Need Lifetime   Patient has (list medical condition): Alzehimer's and Vasular dementia   The above medical condition requires: Patient requires the ability to reposition frequently   Head must be elevated greater than: 30 degrees   Bed type Semi-electric   Hoyer Lift Yes   Support Surface: Gel Overlay      07/24/20 1235   07/24/20 1233  For home use only DME lightweight manual wheelchair with seat cushion  Once       Comments: Patient suffers from mixed alzheimer's and vascular dementia which impairs their ability to perform daily activities like bathing, dressing, ambulating in the home.  A walker will not resolve  issue with performing activities of daily living. A wheelchair will allow patient to safely perform daily activities. Patient is not able to propel themselves in the home using a standard weight wheelchair due to weakness. Patient can self propel in the lightweight wheelchair. Length of need lifetime. Accessories: elevating leg rests (ELRs), wheel locks, extensions and anti-tippers, seat cushion and back cushion.   07/24/20 1235

## 2020-07-24 NOTE — Progress Notes (Signed)
PT Cancellation Note  Patient Details Name: Elijah Dominguez MRN: [id number] DOB: 09-Apr-1936   Cancelled Treatment:    Reason Eval/Treat Not Completed: Pt screened, spoke to patient's daughter via phone call who is pt's primary caregiver who requested no PT services for the patient at this time.  Per patient's daughter the patient has been dependent level of care for over a month and prior to that required extensive assistance for all tasks.  Pt's daughter stated that he has had PT in the recent past and is not interested in further PT services at this time.  Pt's daughter stated that she and other family are able to meet all of the patient's needs at this time and reiterated that she did not want PT services for the patient.  Will complete PT orders at this time but will reassess pt pending a change in status upon receipt of new PT orders.    Ovidio Hanger PT, DPT 07/24/20, 5:00 PM

## 2020-07-24 NOTE — Progress Notes (Signed)
Pharmacy Antibiotic Note  Elijah Dominguez is a 84 y.o. male admitted on 07/21/2020 with pneumonia/aspiration PNA  Pharmacy has been consulted for Unasyn and Vancomycin dosing. -received CTX/Azith x 1 each in ED -MRSA PCR +  Plan: Day 3 Renal fxn has improved Scr 1.50>1.25.  Will adjust Vancomycin from 1000mg  IV q24h to 1500mg  IV q24h. F/u renal fxn  -Unasyn 3gm IV q6h   Height: 5\' 11"  (180.3 cm) Weight: 71.2 kg (157 lb) IBW/kg (Calculated) : 75.3  Temp (24hrs), Avg:98 F (36.7 C), Min:97.5 F (36.4 C), Max:98.3 F (36.8 C)  Recent Labs  Lab 07/21/20 2048 07/21/20 2311 07/22/20 1102 07/23/20 0500 07/24/20 0436  WBC 11.5*  --  10.5  --  14.6*  CREATININE  --  1.68* 1.87* 1.50* 1.25*    Estimated Creatinine Clearance: 45.9 mL/min (A) (by C-G formula based on SCr of 1.25 mg/dL (H)).    No Known Allergies  Antimicrobials this admission: CTX/Azith x 1 dose each Vanc 11/1 >> Unasyn 11/1 >>  Dose adjustments this admission:    Microbiology results: 11/1 BCx: NGx2d 10/31 UCx: E.coli- pan sensitive    Sputum:    11/1 MRSA PCR: +  Thank you for allowing pharmacy to be a part of this patient's care.  Devlin Brink A 07/24/2020 10:28 AM

## 2020-07-24 NOTE — Progress Notes (Addendum)
PROGRESS NOTE    Elijah Dominguez  XLK:[id number] DOB: 06/28/1936 DOA: 07/21/2020 PCP: Tracie Harrier, MD    Brief Narrative:  Elijah Dominguez a 84 y.o.malewith medical history significant formixed Alzheimer's and vascular dementia, HTN, T2DM, history of CVA, BPH s/p laser TURP who presents to the ED for evaluation ofnausea and vomiting.  Per daughter, patient has dementia with confusion at baseline and has been nonambulatory for the last month. She says he has been eating well but has been losing weight. Yesterday he began to have new onset of nausea and vomiting.  In the emergency room, patient was a found to have secondary hypoxemia, was placed on 4 L oxygen.  Chest x-ray showed bilateral atelectasis/infiltrates, right greater than left.  Her KUB showed small bowel obstruction.    Assessment & Plan:   Principal Problem:   Severe sepsis with acute organ dysfunction (HCC) Active Problems:   AKI (acute kidney injury) ([place])   Complicated UTI (urinary tract infection)   Type 2 diabetes mellitus ([place])   Hypertension associated with diabetes ([place])   Mixed Alzheimer's and vascular dementia ([place])   Aspiration pneumonia ([place])   SBO (small bowel obstruction) ([place])   Pressure injury of skin  #1.  Severe sepsis secondary to UTI and aspiration pneumonia. Patient was giving IV fluids, he is hemodynamically stable.  Initially placed on Unasyn and vancomycin.  Condition is improving.  #2.  Right lower lobe aspiration pneumonia.  POA.  Ruled in. I personally reviewed patient chest x-ray, combined with the patient history, patient does have aspiration pneumonia secondary to nausea vomiting from bowel obstruction. Continue Unasyn.  3.  Acute on chronic hypoxemic respite failure secondary to aspiration pneumonia. Continue oxygen treatment.  4.  Small bowel obstruction. Patient has also been losing weight significantly.  He has not had a colonoscopy by more than 5 to 10 years.  Will need  colonoscopy as outpatient to rule out malignancy in the colon. Patient has had 2 bowel movements yesterday, no longer has any vomiting.  Repeat KUB showed significant stool in the colon.  Obstruction is better.  Diet started, will advance to soft diet.  Also added senna for constipation.  5.  Type 2 diabetes.  Well controlled.  6.  Acute kidney injury on chronic kidney disease stage IIIb. Renal function is gradually getting better.  7.  Hyponatremia with hypokalemia. Change fluids to D5 with added potassium.  Also supplement potassium orally.  8.  Mixed Alzheimer's and vascular dementia. Patient is quite confused which is his baseline.  9.  Possible UTI. Pending urine culture results.  10. Stage 2 pressure injury to scrotum, POA     DVT prophylaxis: Heparin Code Status: DNR Family Communication: Met patient daughter in the room, all questions answered. Disposition Plan:  .   Status is: Inpatient  Remains inpatient appropriate because:Inpatient level of care appropriate due to severity of illness   Dispo: The patient is from: Home              Anticipated d/c is to: Home              Anticipated d/c date is: 2 days              Patient currently is not medically stable to d/c.        I/O last 3 completed shifts: In: 3871.8 [I.V.:3065.6; IV Piggyback:806.2] Out: 1000 [Urine:1000] No intake/output data recorded.     Consultants:   None  Procedures: None  Antimicrobials:Unasyn  Subjective: Patient has some confusion.  Abdominal pain nausea vomiting has resolved.  He was able to tolerate a clear diet, advance diet to soft. Not short of breath today, no cough. No fever or chills. No dysuria hematuria. No signal headache or dizziness.  Objective: Vitals:   07/24/20 0100 07/24/20 0529 07/24/20 0806 07/24/20 1244  BP: 140/64 (!) 158/74 132/78 (!) 147/73  Pulse: 80 89 80 95  Resp: 17 16 18 17   Temp: 98.2 F (36.8 C) 98.3 F (36.8 C) (!) 97.5 F (36.4 C)  97.7 F (36.5 C)  TempSrc:   Axillary Oral  SpO2: 100%  99% 100%  Weight:      Height:        Intake/Output Summary (Last 24 hours) at 07/24/2020 1326 Last data filed at 07/24/2020 0600 Gross per 24 hour  Intake 2924.58 ml  Output --  Net 2924.58 ml   Filed Weights   07/22/20 0847  Weight: 71.2 kg    Examination:  General exam: Appears calm and comfortable, ill-appearing, appear to be severely malnourished with muscle atrophy. Respiratory system: Clear to auscultation. Respiratory effort normal. Cardiovascular system: S1 & S2 heard, RRR. No JVD, murmurs, rubs, gallops or clicks. No pedal edema. Gastrointestinal system: Abdomen is nondistended, soft and nontender. No organomegaly or masses felt. Normal bowel sounds heard. Central nervous system: Alert and oriented x1. No focal neurological deficits. Extremities: Symmetric  Skin: No rashes, lesions or ulcers     Data Reviewed: I have personally reviewed following labs and imaging studies  CBC: Recent Labs  Lab 07/21/20 2048 07/22/20 1102 07/23/20 0838 07/24/20 0436  WBC 11.5* 10.5  --  14.6*  NEUTROABS 9.4*  --   --  12.8*  HGB 11.4* 9.3* 9.1* 8.0*  HCT 35.3* 27.9* 27.8* 23.1*  MCV 73.4* 69.9*  --  70.0*  PLT 279 267  --  419   Basic Metabolic Panel: Recent Labs  Lab 07/21/20 2311 07/22/20 1102 07/23/20 0500 07/24/20 0436  NA 144 142 146* 154*  K 4.2 4.1 3.5 3.2*  CL 103 103 111 119*  CO2 24 26 25 24   GLUCOSE 175* 179* 87 138*  BUN 50* 61* 52* 54*  CREATININE 1.68* 1.87* 1.50* 1.25*  CALCIUM 8.3* 8.2* 8.1* 8.3*  MG  --   --   --  2.4   GFR: Estimated Creatinine Clearance: 45.9 mL/min (A) (by C-G formula based on SCr of 1.25 mg/dL (H)). Liver Function Tests: Recent Labs  Lab 07/21/20 2311  AST 33  ALT 14  ALKPHOS 53  BILITOT 1.1  PROT 6.3*  ALBUMIN 2.7*   Recent Labs  Lab 07/21/20 2311  LIPASE 20   No results for input(s): AMMONIA in the last 168 hours. Coagulation Profile: No results  for input(s): INR, PROTIME in the last 168 hours. Cardiac Enzymes: No results for input(s): CKTOTAL, CKMB, CKMBINDEX, TROPONINI in the last 168 hours. BNP (last 3 results) No results for input(s): PROBNP in the last 8760 hours. HbA1C: No results for input(s): HGBA1C in the last 72 hours. CBG: Recent Labs  Lab 07/22/20 1412 07/22/20 1727 07/23/20 0836 07/23/20 1533  GLUCAP 144* 122* 81 131*   Lipid Profile: No results for input(s): CHOL, HDL, LDLCALC, TRIG, CHOLHDL, LDLDIRECT in the last 72 hours. Thyroid Function Tests: No results for input(s): TSH, T4TOTAL, FREET4, T3FREE, THYROIDAB in the last 72 hours. Anemia Panel: No results for input(s): VITAMINB12, FOLATE, FERRITIN, TIBC, IRON, RETICCTPCT in the last 72 hours. Sepsis Labs: Recent Labs  Lab  07/22/20 1102  PROCALCITON 33.44    Recent Results (from the past 240 hour(s))  Respiratory Panel by RT PCR (Flu A&B, Covid) - Nasopharyngeal Swab     Status: None   Collection Time: 07/21/20  8:48 PM   Specimen: Nasopharyngeal Swab  Result Value Ref Range Status   SARS Coronavirus 2 by RT PCR NEGATIVE NEGATIVE Final    Comment: (NOTE) SARS-CoV-2 target nucleic acids are NOT DETECTED.  The SARS-CoV-2 RNA is generally detectable in upper respiratoy specimens during the acute phase of infection. The lowest concentration of SARS-CoV-2 viral copies this assay can detect is 131 copies/mL. A negative result does not preclude SARS-Cov-2 infection and should not be used as the sole basis for treatment or other patient management decisions. A negative result may occur with  improper specimen collection/handling, submission of specimen other than nasopharyngeal swab, presence of viral mutation(s) within the areas targeted by this assay, and inadequate number of viral copies (<131 copies/mL). A negative result must be combined with clinical observations, patient history, and epidemiological information. The expected result is  Negative.  Fact Sheet for Patients:  PinkCheek.be  Fact Sheet for Healthcare Providers:  GravelBags.it  This test is no t yet approved or cleared by the [place] FDA and  has been authorized for detection and/or diagnosis of SARS-CoV-2 by FDA under an Emergency Use Authorization (EUA). This EUA will remain  in effect (meaning this test can be used) for the duration of the COVID-19 declaration under Section 564(b)(1) of the Act, 21 U.S.C. section 360bbb-3(b)(1), unless the authorization is terminated or revoked sooner.     Influenza A by PCR NEGATIVE NEGATIVE Final   Influenza B by PCR NEGATIVE NEGATIVE Final    Comment: (NOTE) The Xpert Xpress SARS-CoV-2/FLU/RSV assay is intended as an aid in  the diagnosis of influenza from Nasopharyngeal swab specimens and  should not be used as a sole basis for treatment. Nasal washings and  aspirates are unacceptable for Xpert Xpress SARS-CoV-2/FLU/RSV  testing.  Fact Sheet for Patients: PinkCheek.be  Fact Sheet for Healthcare Providers: GravelBags.it  This test is not yet approved or cleared by the [place] FDA and  has been authorized for detection and/or diagnosis of SARS-CoV-2 by  FDA under an Emergency Use Authorization (EUA). This EUA will remain  in effect (meaning this test can be used) for the duration of the  Covid-19 declaration under Section 564(b)(1) of the Act, 21  U.S.C. section 360bbb-3(b)(1), unless the authorization is  terminated or revoked. Performed at [facility], [address] [place], [place] [address]   Urine culture     Status: Abnormal   Collection Time: 07/21/20  8:48 PM   Specimen: Urine, Random  Result Value Ref Range Status   Specimen Description   Final    URINE, RANDOM Performed at [facility], [address], [place], [place] [address]    Special  Requests   Final    NONE Performed at [facility], [address] [place], [place] [address]    Culture >=100,000 COLONIES/mL ESCHERICHIA COLI (A)  Final   Report Status 07/23/2020 FINAL  Final   Organism ID, Bacteria ESCHERICHIA COLI (A)  Final      Susceptibility   Escherichia coli - MIC*    AMPICILLIN 8 SENSITIVE Sensitive     CEFAZOLIN <=4 SENSITIVE Sensitive     CEFEPIME <=0.12 SENSITIVE Sensitive     CEFTRIAXONE <=0.25 SENSITIVE Sensitive     CIPROFLOXACIN <=0.25 SENSITIVE Sensitive  GENTAMICIN <=1 SENSITIVE Sensitive     IMIPENEM <=0.25 SENSITIVE Sensitive     NITROFURANTOIN <=16 SENSITIVE Sensitive     TRIMETH/SULFA <=20 SENSITIVE Sensitive     AMPICILLIN/SULBACTAM 4 SENSITIVE Sensitive     PIP/TAZO <=4 SENSITIVE Sensitive     * >=100,000 COLONIES/mL ESCHERICHIA COLI  CULTURE, BLOOD (ROUTINE X 2) w Reflex to ID Panel     Status: None (Preliminary result)   Collection Time: 07/22/20 11:02 AM   Specimen: BLOOD  Result Value Ref Range Status   Specimen Description BLOOD RIGHT ANTECUBITAL  Final   Special Requests   Final    BOTTLES DRAWN AEROBIC ONLY Blood Culture adequate volume   Culture   Final    NO GROWTH 2 DAYS Performed at [facility], [address], [place], [place] [address]    Report Status PENDING  Incomplete  CULTURE, BLOOD (ROUTINE X 2) w Reflex to ID Panel     Status: None (Preliminary result)   Collection Time: 07/22/20 11:03 AM   Specimen: BLOOD  Result Value Ref Range Status   Specimen Description BLOOD BLOOD RIGHT HAND  Final   Special Requests   Final    BOTTLES DRAWN AEROBIC ONLY Blood Culture results may not be optimal due to an inadequate volume of blood received in culture bottles   Culture   Final    NO GROWTH 2 DAYS Performed at [facility], [address], [place], [place] [address]    Report Status PENDING  Incomplete  MRSA PCR Screening     Status: Abnormal   Collection Time: 07/22/20  9:00 PM    Specimen: Nasopharyngeal  Result Value Ref Range Status   MRSA by PCR POSITIVE (A) NEGATIVE Final    Comment:        The GeneXpert MRSA Assay (FDA approved for NASAL specimens only), is one component of a comprehensive MRSA colonization surveillance program. It is not intended to diagnose MRSA infection nor to guide or monitor treatment for MRSA infections. RESULT CALLED TO, READ BACK BY AND VERIFIED WITH: Earlie Server RN [id number] 07/22/20 HNM Performed at [facility], [address], [place],  [address]          Radiology Studies: DG Abd 1 View  Result Date: 07/24/2020 CLINICAL DATA:  Small-bowel obstruction EXAM: ABDOMEN - 1 VIEW COMPARISON:  July 23, 2020 FINDINGS: Persistent dilation of loops of small bowel in the upper abdomen measuring up to 4.2 cm in diameter, similar in comparison to prior. High density moderate volume feces within the rectosigmoid colon. Air is visualized in the rectum. Degenerative changes lower lumbar spine. IMPRESSION: Persistent dilation of small bowel in the upper abdomen, similar in comparison to prior. Electronically Signed   By: Valentino Saxon MD   On: 07/24/2020 08:57   DG Abd 1 View  Result Date: 07/23/2020 CLINICAL DATA:  Follow-up small bowel obstruction. Removal of NG tube earlier today. EXAM: ABDOMEN - 1 VIEW COMPARISON:  Radiographs earlier today and yesterday. FINDINGS: Dilated loops of small bowel measuring up to 4 cm, not significantly changed from earlier today. There is stool in the left colon. Enteric contrast projects over the rectosigmoid colon from prior barium swallow. No evidence of free air. Stable osseous structures. IMPRESSION: Dilated small bowel measuring up to 4 cm, not significantly changed from earlier today, may favoring ileus. Enteric contrast projects over the rectosigmoid colon from prior barium swallow. Electronically Signed   By: Keith Rake M.D.   On: 07/23/2020  16:43   DG Abd 1  View  Result Date: 07/23/2020 CLINICAL DATA:  Nausea vomiting.  Weight loss. EXAM: ABDOMEN - 1 VIEW COMPARISON:  07/22/2020. FINDINGS: Hemidiaphragms not completely imaged. NG tube not visualized. Surgical sutures noted in the abdomen. Persistent but improved dilated loops of small bowel. Air noted in the colon. Barium noted in the rectum. Degenerative changes lumbar spine and both hips. IMPRESSION: 1. NG tube not visualized. 2. Persistent but improved dilated loops of small bowel. Air noted in the colon. Barium noted in the rectum. Electronically Signed   By: Marcello Moores  Register   On: 07/23/2020 09:09   DG Abd 1 View  Result Date: 07/23/2020 CLINICAL DATA:  NG tube placement EXAM: ABDOMEN - 1 VIEW COMPARISON:  07/22/2020 FINDINGS: NG tube has been retracted with the tip now in the proximal stomach. The side port is in the distal esophagus. Dilated small bowel loops in the abdomen, worsening since prior study. IMPRESSION: NG tube pulled back into the proximal stomach. Dilated small bowel loops, worsening since prior study. Electronically Signed   By: Rolm Baptise M.D.   On: 07/23/2020 00:16        Scheduled Meds: . mupirocin ointment  1 application Nasal BID  . potassium chloride  40 mEq Oral Once  . senna-docusate  2 tablet Oral BID   Continuous Infusions: . ampicillin-sulbactam (UNASYN) IV 3 g (07/24/20 0753)  . dextrose 5 % with KCl 20 mEq / L       LOS: 2 days    Time spent: 38 minutes    Sharen Hones, MD [facility]   To contact the attending provider between 7A-7P or the covering provider during after hours 7P-7A, please log into the web site [email or web address] and access using universal [place] password for that web site. If you do not have the password, please call the hospital operator.  07/24/2020, 1:26 PM

## 2020-07-24 NOTE — TOC Initial Note (Signed)
Transition of Care [facility] Dominguez) - Initial/Assessment Note    Patient Details  Name: Elijah Dominguez MRN: [id number] Date of Birth: 16-Dec-1935  Transition of Care [facility]) CM/SW Contact:    Allayne Butcher, RN Phone Number: 07/24/2020, 12:38 PM  Clinical Narrative:                 Patient admitted to the hospital with acute kidney injury, patient has a history of dementia.  Patient is from home, his daughter Elijah Dominguez moved in with him to take care of him.  Elijah Dominguez is at the bedside this morning and reports that patient requires assistance for all ADL's.  Daughter would like for the patient to return home, no SNF.  Daughter reports that they have had home health in the past for PT but doesn't see a need in it now with the patient's dementia.    Daughter needs a hospital bed, hoyer lift, and wheelchair, orders entered and referral given to [place] with [employer].  Daughter will take patient home at discharge.   Expected Discharge Plan: Home/Self Care Barriers to Discharge: Continued Medical Work up   Patient Goals and CMS Choice Patient states their goals for this hospitalization and ongoing recovery are:: Daughter Elijah Dominguez at the bedside and wants to take the patient home at discharge. CMS Medicare.gov Compare Post Acute Care list provided to:: Patient Represenative (must comment) Choice offered to / list presented to : Adult Children  Expected Discharge Plan and Services Expected Discharge Plan: Home/Self Care   Discharge Planning Services: CM Consult Post Acute Care Choice: Durable Medical Equipment Living arrangements for the past 2 months: Single Family Home                 DME Arranged: Hospital bed, Lightweight manual wheelchair with seat cushion DME Agency: [employer] Date DME Agency Contacted: 07/24/20 Time DME Agency Contacted: 1231 Representative spoke with at DME Agency: Elease Hashimoto [facility] Dom[facility]uez Arranged: NA          Prior Living Arrangements/Services Living arrangements for the past 2 months:  Single Family Home Lives with:: Adult Children Patient language and need for interpreter reviewed:: Yes Do you feel safe going back to the place where you live?: Yes      Need for Family Participation in Patient Care: Yes (Comment) (dementia) Care giver support system in place?: Yes (comment) (daughter) Current home services: DME (walker and cane) Criminal Activity/Legal Involvement Pertinent to Current Situation/Hospitalization: No - Comment as needed  Activities of Daily Living   ADL Screening (condition at time of admission) Patient's cognitive ability adequate to safely complete daily activities?: No Is the patient deaf or have difficulty hearing?: Yes Does the patient have difficulty seeing, even when wearing glasses/contacts?:  (unsure) Does the patient have difficulty concentrating, remembering, or making decisions?: Yes Patient able to express need for assistance with ADLs?: No Does the patient have difficulty dressing or bathing?: Yes Independently performs ADLs?: No Communication: Needs assistance Dressing (OT): Dependent Is this a change from baseline?: Pre-admission baseline Grooming: Dependent Is this a change from baseline?: Pre-admission baseline Feeding: Dependent Is this a change from baseline?: Pre-admission baseline Bathing: Dependent Is this a change from baseline?: Pre-admission baseline Toileting: Dependent Is this a change from baseline?: Pre-admission baseline In/Out Bed: Dependent Is this a change from baseline?: Pre-admission baseline Walks in Home: Dependent Is this a change from baseline?: Pre-admission baseline Does the patient have difficulty walking or climbing stairs?: Yes Weakness of Legs: Both Weakness of Arms/Hands: Both  Permission Sought/Granted Permission sought  to share information with : Case Manager, Family Supports Permission granted to share information with : Yes, Verbal Permission Granted  Share Information with NAME: Elijah Dominguez      Permission granted to share info w Relationship: daughter     Emotional Assessment Appearance:: Appears stated age Attitude/Demeanor/Rapport: Unable to Assess Affect (typically observed): Unable to Assess Orientation: : Oriented to Self Alcohol / Substance Use: Not Applicable Psych Involvement: No (comment)  Admission diagnosis:  Lower urinary tract infectious disease [N39.0] Acute respiratory failure with hypoxia (HCC) [J96.01] Nausea and vomiting [R11.2] Sepsis (HCC) [A41.9] Altered mental status, unspecified altered mental status type [R41.82] SBO (small bowel obstruction) (HCC) [K56.609] Patient Active Problem List   Diagnosis Date Noted  . Aspiration pneumonia (HCC) 07/22/2020  . SBO (small bowel obstruction) (HCC) 07/22/2020  . Pressure injury of skin 07/22/2020  . Severe sepsis with acute organ dysfunction (HCC) 07/21/2020  . Type 2 diabetes mellitus (HCC)   . Hypertension associated with diabetes (HCC)   . Mixed Alzheimer's and vascular dementia (HCC)   . HTN (hypertension) 03/19/2019  . Acute encephalopathy 03/19/2019  . CKD (chronic kidney disease), stage III (HCC) 03/19/2019  . Complicated UTI (urinary tract infection) 03/19/2019  . AKI (acute kidney injury) (HCC) 10/01/2016  . Urinary tract infection 10/01/2016  . Acute urinary retention 10/01/2016  . Generalized weakness 10/01/2016  . Leukocytosis 10/01/2016  . Diarrhea 10/01/2016  . Left shoulder pain 10/01/2016   PCP:  Barbette Reichmann, MD Pharmacy:   [facility] [address], [place] - [address] [place] [address] Phone: [phone] Fax: [phone]     Social Determinants of Health (SDOH) Interventions    Readmission Risk Interventions No flowsheet data found.

## 2020-07-25 ENCOUNTER — Inpatient Hospital Stay: Payer: Medicare HMO

## 2020-07-25 ENCOUNTER — Encounter: Payer: Self-pay | Admitting: Internal Medicine

## 2020-07-25 DIAGNOSIS — J690 Pneumonitis due to inhalation of food and vomit: Secondary | ICD-10-CM | POA: Diagnosis not present

## 2020-07-25 DIAGNOSIS — R4182 Altered mental status, unspecified: Secondary | ICD-10-CM | POA: Diagnosis not present

## 2020-07-25 DIAGNOSIS — E43 Unspecified severe protein-calorie malnutrition: Secondary | ICD-10-CM

## 2020-07-25 DIAGNOSIS — B9620 Unspecified Escherichia coli [E. coli] as the cause of diseases classified elsewhere: Secondary | ICD-10-CM

## 2020-07-25 DIAGNOSIS — D649 Anemia, unspecified: Secondary | ICD-10-CM | POA: Diagnosis not present

## 2020-07-25 DIAGNOSIS — A419 Sepsis, unspecified organism: Secondary | ICD-10-CM | POA: Diagnosis not present

## 2020-07-25 DIAGNOSIS — N179 Acute kidney failure, unspecified: Secondary | ICD-10-CM | POA: Diagnosis not present

## 2020-07-25 DIAGNOSIS — N390 Urinary tract infection, site not specified: Secondary | ICD-10-CM | POA: Diagnosis not present

## 2020-07-25 LAB — CBC WITH DIFFERENTIAL/PLATELET
Abs Immature Granulocytes: 0.74 10*3/uL — ABNORMAL HIGH (ref 0.00–0.07)
Basophils Absolute: 0 10*3/uL (ref 0.0–0.1)
Basophils Relative: 0 %
Eosinophils Absolute: 0 10*3/uL (ref 0.0–0.5)
Eosinophils Relative: 0 %
HCT: 16.6 % — ABNORMAL LOW (ref 39.0–52.0)
Hemoglobin: 5.6 g/dL — ABNORMAL LOW (ref 13.0–17.0)
Immature Granulocytes: 5 %
Lymphocytes Relative: 5 %
Lymphs Abs: 0.7 10*3/uL (ref 0.7–4.0)
MCH: 23.8 pg — ABNORMAL LOW (ref 26.0–34.0)
MCHC: 33.7 g/dL (ref 30.0–36.0)
MCV: 70.6 fL — ABNORMAL LOW (ref 80.0–100.0)
Monocytes Absolute: 1.1 10*3/uL — ABNORMAL HIGH (ref 0.1–1.0)
Monocytes Relative: 7 %
Neutro Abs: 13.4 10*3/uL — ABNORMAL HIGH (ref 1.7–7.7)
Neutrophils Relative %: 83 %
Platelets: 212 10*3/uL (ref 150–400)
RBC: 2.35 MIL/uL — ABNORMAL LOW (ref 4.22–5.81)
RDW: 17 % — ABNORMAL HIGH (ref 11.5–15.5)
WBC: 16 10*3/uL — ABNORMAL HIGH (ref 4.0–10.5)
nRBC: 0.1 % (ref 0.0–0.2)

## 2020-07-25 LAB — HEPATIC FUNCTION PANEL
ALT: 10 U/L (ref 0–44)
AST: 19 U/L (ref 15–41)
Albumin: 2.3 g/dL — ABNORMAL LOW (ref 3.5–5.0)
Alkaline Phosphatase: 35 U/L — ABNORMAL LOW (ref 38–126)
Bilirubin, Direct: <0.1 mg/dL (ref 0.0–0.2)
Total Bilirubin: 0.6 mg/dL (ref 0.3–1.2)
Total Protein: 5.2 g/dL — ABNORMAL LOW (ref 6.5–8.1)

## 2020-07-25 LAB — RETIC PANEL
Immature Retic Fract: 13.4 % (ref 2.3–15.9)
RBC.: 2.14 MIL/uL — ABNORMAL LOW (ref 4.22–5.81)
Retic Count, Absolute: 23.8 10*3/uL (ref 19.0–186.0)
Retic Ct Pct: 1.1 % (ref 0.4–3.1)
Reticulocyte Hemoglobin: 22.9 pg — ABNORMAL LOW (ref >27.9)

## 2020-07-25 LAB — BASIC METABOLIC PANEL
Anion gap: 14 (ref 5–15)
BUN: 59 mg/dL — ABNORMAL HIGH (ref 8–23)
CO2: 22 mmol/L (ref 22–32)
Calcium: 8.3 mg/dL — ABNORMAL LOW (ref 8.9–10.3)
Chloride: 118 mmol/L — ABNORMAL HIGH (ref 98–111)
Creatinine, Ser: 1.35 mg/dL — ABNORMAL HIGH (ref 0.61–1.24)
GFR, Estimated: 52 mL/min — ABNORMAL LOW (ref >60)
Glucose, Bld: 235 mg/dL — ABNORMAL HIGH (ref 70–99)
Potassium: 3 mmol/L — ABNORMAL LOW (ref 3.5–5.1)
Sodium: 154 mmol/L — ABNORMAL HIGH (ref 135–145)

## 2020-07-25 LAB — HEMOGLOBIN AND HEMATOCRIT, BLOOD
HCT: 14.5 % — CL (ref 39.0–52.0)
Hemoglobin: 4.8 g/dL — CL (ref 13.0–17.0)

## 2020-07-25 LAB — IRON AND TIBC
Iron: 109 ug/dL (ref 45–182)
Saturation Ratios: 79 % — ABNORMAL HIGH (ref 17.9–39.5)
TIBC: 137 ug/dL — ABNORMAL LOW (ref 250–450)
UIBC: 28 ug/dL

## 2020-07-25 LAB — PREPARE RBC (CROSSMATCH)

## 2020-07-25 LAB — ABO/RH: ABO/RH(D): B POS

## 2020-07-25 LAB — MAGNESIUM: Magnesium: 2.3 mg/dL (ref 1.7–2.4)

## 2020-07-25 LAB — VITAMIN B12: Vitamin B-12: 1868 pg/mL — ABNORMAL HIGH (ref 180–914)

## 2020-07-25 LAB — LACTATE DEHYDROGENASE: LDH: 177 U/L (ref 98–192)

## 2020-07-25 MED ORDER — PANTOPRAZOLE SODIUM 40 MG IV SOLR
40.0000 mg | Freq: Two times a day (BID) | INTRAVENOUS | Status: DC
  Administered 2020-07-25 – 2020-07-26 (×3): 40 mg via INTRAVENOUS
  Filled 2020-07-25 (×3): qty 40

## 2020-07-25 MED ORDER — IOHEXOL 300 MG/ML  SOLN
80.0000 mL | Freq: Once | INTRAMUSCULAR | Status: AC | PRN
  Administered 2020-07-25: 18:00:00 80 mL via INTRAVENOUS

## 2020-07-25 MED ORDER — SODIUM CHLORIDE 0.9% IV SOLUTION: Freq: Once | INTRAVENOUS | Status: AC

## 2020-07-25 MED ORDER — METOLAZONE 5 MG PO TABS
5.0000 mg | ORAL_TABLET | Freq: Once | ORAL | Status: AC
  Administered 2020-07-25: 5 mg via ORAL
  Filled 2020-07-25: qty 1

## 2020-07-25 MED ORDER — POTASSIUM CL IN DEXTROSE 5% 20 MEQ/L IV SOLN
20.0000 meq | INTRAVENOUS | Status: DC
  Administered 2020-07-25: 08:00:00 20 meq via INTRAVENOUS
  Filled 2020-07-25 (×2): qty 1000

## 2020-07-25 MED ORDER — POTASSIUM CL IN DEXTROSE 5% 20 MEQ/L IV SOLN
20.0000 meq | INTRAVENOUS | Status: DC
  Administered 2020-07-25: 20 meq via INTRAVENOUS
  Filled 2020-07-25 (×2): qty 1000

## 2020-07-25 MED ORDER — POTASSIUM CHLORIDE 10 MEQ/100ML IV SOLN
10.0000 meq | INTRAVENOUS | Status: AC
  Administered 2020-07-25 (×2): 10 meq via INTRAVENOUS
  Filled 2020-07-25 (×2): qty 100

## 2020-07-25 MED ORDER — LACTULOSE 10 GM/15ML PO SOLN
20.0000 g | Freq: Once | ORAL | Status: AC
  Administered 2020-07-25: 09:00:00 20 g via ORAL
  Filled 2020-07-25: qty 30

## 2020-07-25 NOTE — Consult Note (Signed)
Midge Minium, MD [facility]  [address] [place], [place] [address] Phone: [phone] Fax : [phone]  Consultation  Referring Provider:     Dr. Chipper Herb Primary Care Physician:  Barbette Reichmann, MD Primary Gastroenterologist: Gentry Fitz         Reason for Consultation:     Significant anemia  Date of Admission:  07/21/2020 Date of Consultation:  07/25/2020         HPI:   Elijah Dominguez is a 84 y.o. male with a history of Alzheimer's dementia and vascular dementia who was found to have anemia.  The patient admission hemoglobin was 11.4 which went down to 9.3 on the first of this month.  It was noted that today the hemoglobin had gone down to 5.6 and a repeat showed the hemoglobin going down to 4.8.  The patient has not had a bowel movement in 2 days and no frank blood has been seen from the upper GI tract or lower GI tract.  Although the patient has dementia he is able to tell me that he is in the hospital and he denies any abdominal pain.  The patient had seen Dr. Marva Panda back in 2007-2009 but I cannot see any sign of any endoscopic procedures done on this patient.  The patient is unable to give any history.  There is reported that the patient's daughter states that the patient has been eating well but has been losing weight.  There is also report that the patient had some nausea and vomiting prior to coming to the hospital.  Past Medical History:  Diagnosis Date  . Arthritis   . Hypertension associated with diabetes (HCC)   . Mixed Alzheimer's and vascular dementia (HCC)   . Neuropathy   . Type 2 diabetes mellitus (HCC)     Past Surgical History:  Procedure Laterality Date  . THULIUM LASER TURP (TRANSURETHRAL RESECTION OF PROSTATE) N/A 11/30/2016   Procedure: THULIUM LASER TURP (TRANSURETHRAL RESECTION OF PROSTATE);  Surgeon: Vanna Scotland, MD;  Location: [facility] ORS;  Service: Urology;  Laterality: N/A;    Prior to Admission medications   Medication Sig Start Date End  Date Taking? Authorizing Provider  gabapentin (NEURONTIN) 100 MG capsule Take 100 mg by mouth 2 (two) times daily. 05/24/20  Yes [provider]  lisinopril (ZESTRIL) 5 MG tablet Take 5 mg by mouth daily. 11/22/19  Yes [provider]  memantine (NAMENDA) 5 MG tablet Take 5 mg by mouth 2 (two) times daily. 12/27/18  Yes [provider]  vitamin B-12 (CYANOCOBALAMIN) 1000 MCG tablet Take 1,000 mcg by mouth daily. 12/30/18  Yes [provider]    Family History  Problem Relation Age of Onset  . Diabetes Mother   . Heart attack Father   . Prostate cancer Neg Hx   . Bladder Cancer Neg Hx   . Kidney cancer Neg Hx      Social History   Tobacco Use  . Smoking status: Never Smoker  . Smokeless tobacco: Never Used  Substance Use Topics  . Alcohol use: No  . Drug use: No    Allergies as of 07/21/2020  . (No Known Allergies)    Review of Systems:    All systems reviewed and negative except where noted in HPI.   Physical Exam:  Vital signs in last 24 hours: Temp:  [97.3 F (36.3 C)-98.7 F (37.1 C)] 97.5 F (36.4 C) (11/04 1321) Pulse Rate:  [95-108] 95 (11/04 1321) Resp:  [16-17] 17 (  11/04 1321) BP: (117-160)/(53-77) 156/77 (11/04 1321) SpO2:  [82 %-100 %] 98 % (11/04 1321) Last BM Date: 07/23/20 General:   Pleasant in NAD Head:  Normocephalic and atraumatic. Eyes:   No icterus.   Conjunctiva pink. PERRLA. Ears:  Normal auditory acuity. Neck:  Supple; no masses or thyroidomegaly Lungs: Respirations even and unlabored. Lungs clear to auscultation bilaterally.   No wheezes, crackles, or rhonchi.  Heart:  Regular rate and rhythm;  Without murmur, clicks, rubs or gallops Abdomen:  Soft, nondistended, nontender. Normal bowel sounds. No appreciable masses or hepatomegaly.  No rebound or guarding.  Rectal:  Not performed. Msk:  Symmetrical without gross deformities.    Extremities:  Without edema, cyanosis or clubbing. Neurologic:  Alert  Skin:   Intact without significant lesions or rashes. Cervical Nodes:  No significant cervical adenopathy. Psych:  Alert and cooperative. Normal affect.  LAB RESULTS: Recent Labs    07/24/20 0436 07/25/20 0544 07/25/20 1221  WBC 14.6* 16.0*  --   HGB 8.0* 5.6* 4.8*  HCT 23.1* 16.6* 14.5*  PLT 227 212  --    BMET Recent Labs    07/23/20 0500 07/24/20 0436 07/25/20 0544  NA 146* 154* 154*  K 3.5 3.2* 3.0*  CL 111 119* 118*  CO2 25 24 22   GLUCOSE 87 138* 235*  BUN 52* 54* 59*  CREATININE 1.50* 1.25* 1.35*  CALCIUM 8.1* 8.3* 8.3*   LFT Recent Labs    07/25/20 0544  PROT 5.2*  ALBUMIN 2.3*  AST 19  ALT 10  ALKPHOS 35*  BILITOT 0.6  BILIDIR <0.1  IBILI NOT CALCULATED   PT/INR No results for input(s): LABPROT, INR in the last 72 hours.  STUDIES: DG Abd 1 View  Result Date: 07/24/2020 CLINICAL DATA:  Small-bowel obstruction EXAM: ABDOMEN - 1 VIEW COMPARISON:  July 23, 2020 FINDINGS: Persistent dilation of loops of small bowel in the upper abdomen measuring up to 4.2 cm in diameter, similar in comparison to prior. High density moderate volume feces within the rectosigmoid colon. Air is visualized in the rectum. Degenerative changes lower lumbar spine. IMPRESSION: Persistent dilation of small bowel in the upper abdomen, similar in comparison to prior. Electronically Signed   By: July 25, 2020 MD   On: 07/24/2020 08:57   DG Abd 1 View  Result Date: 07/23/2020 CLINICAL DATA:  Follow-up small bowel obstruction. Removal of NG tube earlier today. EXAM: ABDOMEN - 1 VIEW COMPARISON:  Radiographs earlier today and yesterday. FINDINGS: Dilated loops of small bowel measuring up to 4 cm, not significantly changed from earlier today. There is stool in the left colon. Enteric contrast projects over the rectosigmoid colon from prior barium swallow. No evidence of free air. Stable osseous structures. IMPRESSION: Dilated small bowel measuring up to 4 cm, not significantly changed from  earlier today, may favoring ileus. Enteric contrast projects over the rectosigmoid colon from prior barium swallow. Electronically Signed   By: 13/10/2019 M.D.   On: 07/23/2020 16:43      Impression / Plan:   Assessment: Principal Problem:   Severe sepsis with acute organ dysfunction (HCC) Active Problems:   AKI (acute kidney injury) (HCC)   E. coli UTI   Type 2 diabetes mellitus (HCC)   Hypertension associated with diabetes (HCC)   Mixed Alzheimer's and vascular dementia (HCC)   Aspiration pneumonia (HCC)   SBO (small bowel obstruction) (HCC)   Pressure injury of skin   Severe protein-calorie malnutrition (HCC)   Elijah Dominguez is a  84 y.o. y/o male with with a drop in hemoglobin. The source of the drop in hemoglobin is not readily apparent. The patient is not having any black or bloody stools and with this amount of drop in hemoglobin I would expect seeing blood somewhere. The patient has been set up for a CT scan of the abdomen. To see if there is any sign of extraluminal bleeding.  Plan:  The plan would be to wait for the CT scan to get back and consider a GI work-up if no other source of bleeding is seen. The patient has had significant weight loss and appears frail at this time. He has also dropped his hemoglobin by a significant amount which is worrisome. He is now being transfused two packed units of red blood. I have discussed the case with the hospitalist and have informed him of my plan.  Thank you for involving me in the care of this patient.      LOS: 3 days   Midge Minium, MD, [facility] 07/25/2020, 3:11 PM,  Pager [phone] 7am-5pm  Check AMION for 5pm -7am coverage and on weekends   Note: This dictation was prepared with Dragon dictation along with smaller phrase technology. Any transcriptional errors that result from this process are unintentional.

## 2020-07-25 NOTE — Progress Notes (Addendum)
Cross cover Greater than 2 gm drop in Hgb this am. Consent for blood transfusion obtained over phone from daughter, Riyad Keena. 2 units PRBC's and serial H & H ordered

## 2020-07-25 NOTE — Progress Notes (Addendum)
PROGRESS NOTE    Elijah Dominguez  ONG:[id number] DOB: 1936-04-12 DOA: 07/21/2020 PCP: Barbette Reichmann, MD   Chief complaint.  Shortness of breath. Brief Narrative:  Elijah Mossa Enochis a 84 y.o.malewith medical history significant formixed Alzheimer's and vascular dementia, HTN, T2DM, history of CVA, BPH s/p laser TURP who presents to the ED for evaluation ofnausea and vomiting.  Per daughter, patient has dementia with confusion at baseline and has been nonambulatory for the last month. She says he has been eating well but has been losing weight. Before admission, he began to have new onset of nausea and vomiting.  In the emergency room, patient was a found to have secondary hypoxemia, was placed on 4 L oxygen.  Chest x-ray showed bilateral atelectasis/infiltrates, right greater than left.  Her KUB showed small bowel obstruction.  Patient is placed on IV antibiotics with Unasyn and vancomycin for aspiration pneumonia.  11/4.  Vancomycin was discontinued on 11/3.  Patient hemoglobin dropped down to 5.8 today, transfusing 2 units PRBC.  Check stool for occult blood, also work-up for hemolytic anemia.  Blood culture came back negative, urine culture positive for E. coli.     Assessment & Plan:   Principal Problem:   Severe sepsis with acute organ dysfunction (HCC) Active Problems:   AKI (acute kidney injury) (HCC)   Complicated UTI (urinary tract infection)   Type 2 diabetes mellitus (HCC)   Hypertension associated with diabetes (HCC)   Mixed Alzheimer's and vascular dementia (HCC)   Aspiration pneumonia (HCC)   SBO (small bowel obstruction) (HCC)   Pressure injury of skin  #1.  Severe sepsis secondary to E. coli urinary tract infection and aspiration pneumonia. Patient currently is hemodynamically stable.  IV fluid changed to D5 water due to hypernatremia.  #2. Acute anemia on chronic anemia. Sudden drop of hemoglobin to 5.8.  Discussed with nurse, patient had a 2 large stools  11/2, no bowel movement since then.  We will give a dose of lactulose, will send out stool for occult blood when he has one. Also check liver function panel, LDH and reticulocyte count. Transfuse 2 units of PRBC. Start IV Protonix.  #3.  Right lower lobe aspiration pneumonia. Secondary to nausea vomiting from small bowel obstruction.  4.  E. coli urinary tract infection. Susceptible to Unasyn.  Continue current antibiotics per  5.  Small bowel obstruction. Repeat x-rays showed stool in the colon, most likely due to constipation.  We will give lactulose today.  #6.  Acute kidney injury on chronic kidney disease stage IIIb. Continue IV fluids.  7.  Hypernatremia with hypokalemia. Sodium level still high after D5 water started.  Increase D5 water.  Spoke with the nurse, patient has poor p.o. intake, continue to follow.   8.  Mixed Alzheimer and vascular dementia. Still confused.  9.  Type 2 diabetes.  Well-controlled.  10.  Severe protein calorie malnutrition.   Prognosis guarded.  Long-term prognosis are poor  Addendum: 1330. Patient hemoglobin dropped down to 4.8.  Currently receiving the first unit of PRBC, will transfuse quickly, prepare for another 2 units.  Also obtain CT abdomen/pelvis with IV contrast to rule out retroperitoneal hematoma.  Patient has not had a bowel movement since giving lactulose.  Also obtain GI consult, in case patient will need an EGD.  Family updated again.   Give a dose of metolazone after PRBC,   DVT prophylaxis: SCDs, heparin discontinued due to concern of GI bleed. Code Status: DNR Family Communication: daughter updated  Disposition Plan:  .   Status is: Inpatient  Remains inpatient appropriate because:Inpatient level of care appropriate due to severity of illness   Dispo: The patient is from: Home              Anticipated d/c is to: Home              Anticipated d/c date is: > 3 days              Patient currently is not medically  stable to d/c.        I/O last 3 completed shifts: In: 4194.5 [P.O.:360; I.V.:2828.3; IV Piggyback:1006.2] Out: -  No intake/output data recorded.     Consultants:  None Procedures: None  Antimicrobials: Unasyn  Subjective: Patient still very confused which is at baseline.  I spoke with the nurse, patient has not had a bowel movement since 11/2 when he had 2 large bowel movements.  No abdominal pain or nausea vomiting. No fever or chills. No short of breath or cough. No dysuria hematuria.  Objective: Vitals:   07/24/20 1757 07/24/20 1940 07/25/20 0010 07/25/20 0453  BP: 120/75 132/70 (!) 160/72 (!) 117/53  Pulse: 100 100 100 (!) 106  Resp: 17 16 16 16   Temp: 98.3 F (36.8 C) 98.4 F (36.9 C) 98.3 F (36.8 C) 98.1 F (36.7 C)  TempSrc:      SpO2: 100% 100%  100%  Weight:      Height:        Intake/Output Summary (Last 24 hours) at 07/25/2020 0859 Last data filed at 07/25/2020 0500 Gross per 24 hour  Intake 1269.87 ml  Output --  Net 1269.87 ml   Filed Weights   07/22/20 0847  Weight: 71.2 kg    Examination:  General exam: Appears calm and comfortable, ill-appearing, chronically malnourished. Respiratory system: Clear to auscultation. Respiratory effort normal. Cardiovascular system: S1 & S2 heard, RRR. No JVD, murmurs, rubs, gallops or clicks. No pedal edema. Gastrointestinal system: Abdomen is nondistended, soft and nontender. No organomegaly or masses felt. Normal bowel sounds heard. Central nervous system: Alert and oriented x1. No focal neurological deficits. Extremities: Symmetric  Skin: No rashes, lesions or ulcers Psychiatry: Mood & affect appropriate.     Data Reviewed: I have personally reviewed following labs and imaging studies  CBC: Recent Labs  Lab 07/21/20 2048 07/22/20 1102 07/23/20 0838 07/24/20 0436 07/25/20 0544  WBC 11.5* 10.5  --  14.6* 16.0*  NEUTROABS 9.4*  --   --  12.8* 13.4*  HGB 11.4* 9.3* 9.1* 8.0* 5.6*  HCT  35.3* 27.9* 27.8* 23.1* 16.6*  MCV 73.4* 69.9*  --  70.0* 70.6*  PLT 279 267  --  227 212   Basic Metabolic Panel: Recent Labs  Lab 07/21/20 2311 07/22/20 1102 07/23/20 0500 07/24/20 0436 07/25/20 0544  NA 144 142 146* 154* 154*  K 4.2 4.1 3.5 3.2* 3.0*  CL 103 103 111 119* 118*  CO2 24 26 25 24 22   GLUCOSE 175* 179* 87 138* 235*  BUN 50* 61* 52* 54* 59*  CREATININE 1.68* 1.87* 1.50* 1.25* 1.35*  CALCIUM 8.3* 8.2* 8.1* 8.3* 8.3*  MG  --   --   --  2.4 2.3   GFR: Estimated Creatinine Clearance: 42.5 mL/min (A) (by C-G formula based on SCr of 1.35 mg/dL (H)). Liver Function Tests: Recent Labs  Lab 07/21/20 2311  AST 33  ALT 14  ALKPHOS 53  BILITOT 1.1  PROT 6.3*  ALBUMIN 2.7*  Recent Labs  Lab 07/21/20 2311  LIPASE 20   No results for input(s): AMMONIA in the last 168 hours. Coagulation Profile: No results for input(s): INR, PROTIME in the last 168 hours. Cardiac Enzymes: No results for input(s): CKTOTAL, CKMB, CKMBINDEX, TROPONINI in the last 168 hours. BNP (last 3 results) No results for input(s): PROBNP in the last 8760 hours. HbA1C: No results for input(s): HGBA1C in the last 72 hours. CBG: Recent Labs  Lab 07/22/20 1412 07/22/20 1727 07/23/20 0836 07/23/20 1533  GLUCAP 144* 122* 81 131*   Lipid Profile: No results for input(s): CHOL, HDL, LDLCALC, TRIG, CHOLHDL, LDLDIRECT in the last 72 hours. Thyroid Function Tests: No results for input(s): TSH, T4TOTAL, FREET4, T3FREE, THYROIDAB in the last 72 hours. Anemia Panel: No results for input(s): VITAMINB12, FOLATE, FERRITIN, TIBC, IRON, RETICCTPCT in the last 72 hours. Sepsis Labs: Recent Labs  Lab 07/22/20 1102  PROCALCITON 33.44    Recent Results (from the past 240 hour(s))  Respiratory Panel by RT PCR (Flu A&B, Covid) - Nasopharyngeal Swab     Status: None   Collection Time: 07/21/20  8:48 PM   Specimen: Nasopharyngeal Swab  Result Value Ref Range Status   SARS Coronavirus 2 by RT PCR  NEGATIVE NEGATIVE Final    Comment: (NOTE) SARS-CoV-2 target nucleic acids are NOT DETECTED.  The SARS-CoV-2 RNA is generally detectable in upper respiratoy specimens during the acute phase of infection. The lowest concentration of SARS-CoV-2 viral copies this assay can detect is 131 copies/mL. A negative result does not preclude SARS-Cov-2 infection and should not be used as the sole basis for treatment or other patient management decisions. A negative result may occur with  improper specimen collection/handling, submission of specimen other than nasopharyngeal swab, presence of viral mutation(s) within the areas targeted by this assay, and inadequate number of viral copies (<131 copies/mL). A negative result must be combined with clinical observations, patient history, and epidemiological information. The expected result is Negative.  Fact Sheet for Patients:  https://www.moore.com/  Fact Sheet for Healthcare Providers:  https://www.young.biz/  This test is no t yet approved or cleared by the [place] FDA and  has been authorized for detection and/or diagnosis of SARS-CoV-2 by FDA under an Emergency Use Authorization (EUA). This EUA will remain  in effect (meaning this test can be used) for the duration of the COVID-19 declaration under Section 564(b)(1) of the Act, 21 U.S.C. section 360bbb-3(b)(1), unless the authorization is terminated or revoked sooner.     Influenza A by PCR NEGATIVE NEGATIVE Final   Influenza B by PCR NEGATIVE NEGATIVE Final    Comment: (NOTE) The Xpert Xpress SARS-CoV-2/FLU/RSV assay is intended as an aid in  the diagnosis of influenza from Nasopharyngeal swab specimens and  should not be used as a sole basis for treatment. Nasal washings and  aspirates are unacceptable for Xpert Xpress SARS-CoV-2/FLU/RSV  testing.  Fact Sheet for Patients: https://www.moore.com/  Fact Sheet for  Healthcare Providers: https://www.young.biz/  This test is not yet approved or cleared by the [place] FDA and  has been authorized for detection and/or diagnosis of SARS-CoV-2 by  FDA under an Emergency Use Authorization (EUA). This EUA will remain  in effect (meaning this test can be used) for the duration of the  Covid-19 declaration under Section 564(b)(1) of the Act, 21  U.S.C. section 360bbb-3(b)(1), unless the authorization is  terminated or revoked. Performed at [facility], [address], [place], [place] [address]   Urine culture  Status: Abnormal   Collection Time: 07/21/20  8:48 PM   Specimen: Urine, Random  Result Value Ref Range Status   Specimen Description   Final    URINE, RANDOM Performed at [facility], [address] [place], [place] [address]    Special Requests   Final    NONE Performed at [facility], [address] [place], [place] [address]    Culture >=100,000 COLONIES/mL ESCHERICHIA COLI (A)  Final   Report Status 07/23/2020 FINAL  Final   Organism ID, Bacteria ESCHERICHIA COLI (A)  Final      Susceptibility   Escherichia coli - MIC*    AMPICILLIN 8 SENSITIVE Sensitive     CEFAZOLIN <=4 SENSITIVE Sensitive     CEFEPIME <=0.12 SENSITIVE Sensitive     CEFTRIAXONE <=0.25 SENSITIVE Sensitive     CIPROFLOXACIN <=0.25 SENSITIVE Sensitive     GENTAMICIN <=1 SENSITIVE Sensitive     IMIPENEM <=0.25 SENSITIVE Sensitive     NITROFURANTOIN <=16 SENSITIVE Sensitive     TRIMETH/SULFA <=20 SENSITIVE Sensitive     AMPICILLIN/SULBACTAM 4 SENSITIVE Sensitive     PIP/TAZO <=4 SENSITIVE Sensitive     * >=100,000 COLONIES/mL ESCHERICHIA COLI  CULTURE, BLOOD (ROUTINE X 2) w Reflex to ID Panel     Status: None (Preliminary result)   Collection Time: 07/22/20 11:02 AM   Specimen: BLOOD  Result Value Ref Range Status   Specimen Description BLOOD RIGHT ANTECUBITAL  Final   Special Requests   Final     BOTTLES DRAWN AEROBIC ONLY Blood Culture adequate volume   Culture   Final    NO GROWTH 3 DAYS Performed at [facility], [address] [place], [place] [address]    Report Status PENDING  Incomplete  CULTURE, BLOOD (ROUTINE X 2) w Reflex to ID Panel     Status: None (Preliminary result)   Collection Time: 07/22/20 11:03 AM   Specimen: BLOOD  Result Value Ref Range Status   Specimen Description BLOOD BLOOD RIGHT HAND  Final   Special Requests   Final    BOTTLES DRAWN AEROBIC ONLY Blood Culture results may not be optimal due to an inadequate volume of blood received in culture bottles   Culture   Final    NO GROWTH 3 DAYS Performed at [facility], [address], [place], [place] [address]    Report Status PENDING  Incomplete  MRSA PCR Screening     Status: Abnormal   Collection Time: 07/22/20  9:00 PM   Specimen: Nasopharyngeal  Result Value Ref Range Status   MRSA by PCR POSITIVE (A) NEGATIVE Final    Comment:        The GeneXpert MRSA Assay (FDA approved for NASAL specimens only), is one component of a comprehensive MRSA colonization surveillance program. It is not intended to diagnose MRSA infection nor to guide or monitor treatment for MRSA infections. RESULT CALLED TO, READ BACK BY AND VERIFIED WITH: Zettie Cooley RN [id number] 07/22/20 HNM Performed at [facility], [address], [place], [place] [address]          Radiology Studies: DG Abd 1 View  Result Date: 07/24/2020 CLINICAL DATA:  Small-bowel obstruction EXAM: ABDOMEN - 1 VIEW COMPARISON:  July 23, 2020 FINDINGS: Persistent dilation of loops of small bowel in the upper abdomen measuring up to 4.2 cm in diameter, similar in comparison to prior. High density moderate volume feces within the rectosigmoid colon. Air is visualized in the rectum. Degenerative  changes lower lumbar spine. IMPRESSION: Persistent dilation of small bowel in the upper abdomen, similar in comparison to  prior. Electronically Signed   By: Meda KlinefelterStephanie  Peacock MD   On: 07/24/2020 08:57   DG Abd 1 View  Result Date: 07/23/2020 CLINICAL DATA:  Follow-up small bowel obstruction. Removal of NG tube earlier today. EXAM: ABDOMEN - 1 VIEW COMPARISON:  Radiographs earlier today and yesterday. FINDINGS: Dilated loops of small bowel measuring up to 4 cm, not significantly changed from earlier today. There is stool in the left colon. Enteric contrast projects over the rectosigmoid colon from prior barium swallow. No evidence of free air. Stable osseous structures. IMPRESSION: Dilated small bowel measuring up to 4 cm, not significantly changed from earlier today, may favoring ileus. Enteric contrast projects over the rectosigmoid colon from prior barium swallow. Electronically Signed   By: Narda RutherfordMelanie  Sanford M.D.   On: 07/23/2020 16:43   DG Abd 1 View  Result Date: 07/23/2020 CLINICAL DATA:  Nausea vomiting.  Weight loss. EXAM: ABDOMEN - 1 VIEW COMPARISON:  07/22/2020. FINDINGS: Hemidiaphragms not completely imaged. NG tube not visualized. Surgical sutures noted in the abdomen. Persistent but improved dilated loops of small bowel. Air noted in the colon. Barium noted in the rectum. Degenerative changes lumbar spine and both hips. IMPRESSION: 1. NG tube not visualized. 2. Persistent but improved dilated loops of small bowel. Air noted in the colon. Barium noted in the rectum. Electronically Signed   By: Maisie Fushomas  Register   On: 07/23/2020 09:09        Scheduled Meds: . sodium chloride   Intravenous Once  . lactulose  20 g Oral Once  . mupirocin ointment  1 application Nasal BID  . senna-docusate  2 tablet Oral BID   Continuous Infusions: . ampicillin-sulbactam (UNASYN) IV 3 g (07/25/20 0803)  . dextrose 5 % with KCl 20 mEq / L 20 mEq (07/25/20 0805)  . potassium chloride 10 mEq (07/25/20 0805)     LOS: 3 days    Time spent: 37 minutes    Marrion Coyekui Hertha Gergen, MD [facility]   To contact the attending  provider between 7A-7P or the covering provider during after hours 7P-7A, please log into the web site [email or web address] and access using universal [place] password for that web site. If you do not have the password, please call the hospital operator.  07/25/2020, 8:59 AM

## 2020-07-25 NOTE — Progress Notes (Signed)
Blood transfusion started.

## 2020-07-25 NOTE — Progress Notes (Signed)
Attempted to contact Power of Attorney-- Joanthony Hamza to obtain the telephone consent form for the blood. No answer at this time, will attempt to call again in one hour.

## 2020-07-26 ENCOUNTER — Encounter: Payer: Self-pay | Admitting: Internal Medicine

## 2020-07-26 DIAGNOSIS — N179 Acute kidney failure, unspecified: Secondary | ICD-10-CM | POA: Diagnosis not present

## 2020-07-26 DIAGNOSIS — A419 Sepsis, unspecified organism: Secondary | ICD-10-CM | POA: Diagnosis not present

## 2020-07-26 DIAGNOSIS — D649 Anemia, unspecified: Secondary | ICD-10-CM | POA: Diagnosis not present

## 2020-07-26 DIAGNOSIS — N390 Urinary tract infection, site not specified: Secondary | ICD-10-CM | POA: Diagnosis not present

## 2020-07-26 DIAGNOSIS — K56609 Unspecified intestinal obstruction, unspecified as to partial versus complete obstruction: Secondary | ICD-10-CM

## 2020-07-26 LAB — BASIC METABOLIC PANEL
Anion gap: 12 (ref 5–15)
BUN: 44 mg/dL — ABNORMAL HIGH (ref 8–23)
CO2: 23 mmol/L (ref 22–32)
Calcium: 8.5 mg/dL — ABNORMAL LOW (ref 8.9–10.3)
Chloride: 117 mmol/L — ABNORMAL HIGH (ref 98–111)
Creatinine, Ser: 1.45 mg/dL — ABNORMAL HIGH (ref 0.61–1.24)
GFR, Estimated: 48 mL/min — ABNORMAL LOW (ref >60)
Glucose, Bld: 142 mg/dL — ABNORMAL HIGH (ref 70–99)
Potassium: 3 mmol/L — ABNORMAL LOW (ref 3.5–5.1)
Sodium: 152 mmol/L — ABNORMAL HIGH (ref 135–145)

## 2020-07-26 LAB — CBC WITH DIFFERENTIAL/PLATELET
Abs Immature Granulocytes: 0.74 10*3/uL — ABNORMAL HIGH (ref 0.00–0.07)
Basophils Absolute: 0.1 10*3/uL (ref 0.0–0.1)
Basophils Relative: 0 %
Eosinophils Absolute: 0 10*3/uL (ref 0.0–0.5)
Eosinophils Relative: 0 %
HCT: 28 % — ABNORMAL LOW (ref 39.0–52.0)
Hemoglobin: 9.6 g/dL — ABNORMAL LOW (ref 13.0–17.0)
Immature Granulocytes: 5 %
Lymphocytes Relative: 8 %
Lymphs Abs: 1.3 10*3/uL (ref 0.7–4.0)
MCH: 26.8 pg (ref 26.0–34.0)
MCHC: 34.3 g/dL (ref 30.0–36.0)
MCV: 78.2 fL — ABNORMAL LOW (ref 80.0–100.0)
Monocytes Absolute: 1 10*3/uL (ref 0.1–1.0)
Monocytes Relative: 6 %
Neutro Abs: 12.7 10*3/uL — ABNORMAL HIGH (ref 1.7–7.7)
Neutrophils Relative %: 81 %
Platelets: 173 10*3/uL (ref 150–400)
RBC: 3.58 MIL/uL — ABNORMAL LOW (ref 4.22–5.81)
RDW: 17.9 % — ABNORMAL HIGH (ref 11.5–15.5)
WBC: 15.9 10*3/uL — ABNORMAL HIGH (ref 4.0–10.5)
nRBC: 0.2 % (ref 0.0–0.2)

## 2020-07-26 LAB — HEMOGLOBIN AND HEMATOCRIT, BLOOD
HCT: 28.5 % — ABNORMAL LOW (ref 39.0–52.0)
HCT: 29.1 % — ABNORMAL LOW (ref 39.0–52.0)
Hemoglobin: 9.6 g/dL — ABNORMAL LOW (ref 13.0–17.0)
Hemoglobin: 9.8 g/dL — ABNORMAL LOW (ref 13.0–17.0)

## 2020-07-26 LAB — OCCULT BLOOD X 1 CARD TO LAB, STOOL: Fecal Occult Bld: NEGATIVE

## 2020-07-26 LAB — PROCALCITONIN: Procalcitonin: 5.96 ng/mL

## 2020-07-26 LAB — MAGNESIUM: Magnesium: 2.3 mg/dL (ref 1.7–2.4)

## 2020-07-26 MED ORDER — POTASSIUM CL IN DEXTROSE 5% 20 MEQ/L IV SOLN
20.0000 meq | INTRAVENOUS | Status: DC
  Administered 2020-07-26 – 2020-07-27 (×2): 20 meq via INTRAVENOUS
  Filled 2020-07-26 (×3): qty 1000

## 2020-07-26 MED ORDER — HEPARIN SODIUM (PORCINE) 5000 UNIT/ML IJ SOLN
5000.0000 [IU] | Freq: Three times a day (TID) | INTRAMUSCULAR | Status: DC
  Administered 2020-07-26 – 2020-07-29 (×6): 5000 [IU] via SUBCUTANEOUS
  Filled 2020-07-26 (×6): qty 1

## 2020-07-26 MED ORDER — AMOXICILLIN-POT CLAVULANATE 500-125 MG PO TABS
1.0000 | ORAL_TABLET | Freq: Two times a day (BID) | ORAL | Status: DC
  Administered 2020-07-26 – 2020-07-29 (×7): 500 mg via ORAL
  Filled 2020-07-26 (×8): qty 1

## 2020-07-26 MED ORDER — METOLAZONE 5 MG PO TABS
5.0000 mg | ORAL_TABLET | Freq: Once | ORAL | Status: AC
  Administered 2020-07-26: 5 mg via ORAL
  Filled 2020-07-26: qty 1

## 2020-07-26 MED ORDER — POTASSIUM CHLORIDE 10 MEQ/100ML IV SOLN
10.0000 meq | INTRAVENOUS | Status: AC
  Administered 2020-07-26 (×2): 10 meq via INTRAVENOUS
  Filled 2020-07-26: qty 100

## 2020-07-26 NOTE — Progress Notes (Signed)
PROGRESS NOTE    Elijah Dominguez  ZOX:[id number] DOB: May 22, 1936 DOA: 07/21/2020 PCP: Elijah Dominguez, Vishwanath, MD   Chief complaint.  Shortness of breath. Brief Narrative:  [name] a 84 y.o.malewith medical history significant formixed Alzheimer's and vascular dementia, HTN, T2DM, history of CVA, BPH s/p laser TURP who presents to the ED for evaluation ofnausea and vomiting.  Per daughter, patient has dementia with confusion at baseline and has been nonambulatory for the last month. She says he has been eating well but has been losing weight. Before admission, he began to have new onset of nausea and vomiting. In the emergency room, patient was a found to have secondary hypoxemia, was placed on 4 L oxygen. Chest x-ray showed bilateral atelectasis/infiltrates, right greater than left. Her KUB showed small bowel obstruction.  Patient is placed on IV antibiotics with Unasyn and vancomycin for aspiration pneumonia.  11/4.  Vancomycin was discontinued on 11/3.  Patient hemoglobin dropped down to 4.8 today, transfusing 3 units PRBC.  Check stool for occult blood, also work-up for hemolytic anemia.  Blood culture came back negative, urine culture positive for Elijah Dominguez. 11/5.  CT scan did not show any intra-abdominal bleeding, stool occult blood negative, adequate B12 and iron level.  Patient does not have active bleeding.   Assessment & Plan:   Principal Problem:   Severe sepsis with acute organ dysfunction (HCC) Active Problems:   AKI (acute kidney injury) (HCC)   Elijah Dominguez UTI   Type 2 diabetes mellitus (HCC)   Hypertension associated with diabetes (HCC)   Mixed Alzheimer's and vascular dementia (HCC)   Aspiration pneumonia (HCC)   SBO (small bowel obstruction) (HCC)   Pressure injury of skin   Severe protein-calorie malnutrition (HCC)   Anemia   Altered mental status  #1.  Severe sepsis secondary to Elijah Dominguez urinary tract infection and aspiration pneumonia. Condition improved.   Procalcitonin level much better.  Antibiotic changed to Augmentin.  #2.  Acute anemia on chronic anemia. CT scan did not show any intra-abdominal hematoma.  Stools negative for occult blood.  Hemoglobin much better after receiving 3 units PRBC. Iron B12 level was also normal.  No evidence of hemolytic anemia. Discontinue IV Protonix. Condition most likely due to bone marrow suppression from acute infection.  #3.  Right lower lobe aspiration pneumonia. Continue Augmentin.  4.  Elijah Dominguez urinary tract infection. Blood culture negative, Elijah Dominguez susceptible to Augmentin.  Antibiotic changed.  5.  Small bowel obstruction. Patient had a multiple bowel movements, condition has resolved. Patient will need outpatient colonoscopy.  Given patient multiple medical conditions, limited prognosis, will discuss with family if test is warranted.  6.  Acute kidney injury on chronic kidney disease stage IIIb. Continue fluids.  7.  Hypernatremia with hypokalemia. Secondary to poor p.o. intake.  Continue D5 water with added potassium.  Also give additional IV potassium supplement per  8.  Mixed Alzheimer and vascular dementia.    DVT prophylaxis:  Heparin Code Status: DNR Family Communication: daughter updated Disposition Plan:  .   Status is: Inpatient  Remains inpatient appropriate because:Inpatient level of care appropriate due to severity of illness   Dispo: The patient is from: Home              Anticipated d/c is to: Home              Anticipated d/c date is: 1 day              Patient  currently is not medically stable to d/c.        I/O last 3 completed shifts: In: 2616.3 [P.O.:600; I.V.:664.2; Blood:837.3; IV Piggyback:514.8] Out: -  Total I/O In: 411.6 [I.V.:311.6; IV Piggyback:100] Out: -      Consultants:   GI  Procedures: None  Antimicrobials:  Augmentin  Subjective: Patient had several soft stools overnight, looks dark,  but occult blood negative.  Appetite  is poor, but no nausea vomiting. No short of breath or cough. No fever or chills.  Objective: Vitals:   07/26/20 0126 07/26/20 0510 07/26/20 0752 07/26/20 1225  BP: (!) 123/59 139/71 (!) 154/67 (!) 141/64  Pulse: (!) 57 86 72 70  Resp: Temp: (!) 97.3 F (36.3 C) 98.8 F (37.1 C) 99.3 F (37.4 C) 99.4 F (37.4 C)  TempSrc: Oral Oral Oral Oral  SpO2: (!) 87% (!) 86%    Weight:      Height:        Intake/Output Summary (Last 24 hours) at 07/26/2020 1400 Last data filed at 07/26/2020 0809 Gross per 24 hour  Intake 1518.04 ml  Output --  Net 1518.04 ml   Filed Weights   07/22/20 0847  Weight: 71.2 kg    Examination:  General exam: Appears calm and comfortable  Respiratory system: Clear to auscultation. Respiratory effort normal. Cardiovascular system: S1 & S2 heard, RRR. No JVD, murmurs, rubs, gallops or clicks. No pedal edema. Gastrointestinal system: Abdomen is nondistended, soft and nontender. No organomegaly or masses felt. Normal bowel sounds heard. Central nervous system: Alert and oriented x1. No focal neurological deficits. Extremities: Symmetric  Skin: No rashes, lesions or ulcers Psychiatry:  Mood & affect appropriate.     Data Reviewed: I have personally reviewed following labs and imaging studies  CBC: Recent Labs  Lab 07/21/20 2048 07/21/20 2048 07/22/20 1102 07/23/20 0838 07/24/20 0436 07/24/20 0436 07/25/20 0544 07/25/20 1221 07/26/20 0028 07/26/20 0610 07/26/20 1213  WBC 11.5*  --  10.5  --  14.6*  --  16.0*  --   --  15.9*  --   NEUTROABS 9.4*  --   --   --  12.8*  --  13.4*  --   --  12.7*  --   HGB 11.4*   < > 9.3*   < > 8.0*   < > 5.6* 4.8* 9.6* 9.6* 9.8*  HCT 35.3*   < > 27.9*   < > 23.1*   < > 16.6* 14.5* 28.5* 28.0* 29.1*  MCV 73.4*  --  69.9*  --  70.0*  --  70.6*  --   --  78.2*  --   PLT 279  --  267  --  227  --  212  --   --  173  --    < > = values in this interval not displayed.   Basic Metabolic Panel: Recent  Labs  Lab 07/22/20 1102 07/23/20 0500 07/24/20 0436 07/25/20 0544 07/26/20 0610  NA 142 146* 154* 154* 152*  K 4.1 3.5 3.2* 3.0* 3.0*  CL 103 111 119* 118* 117*  CO2 GLUCOSE 179* 87 138* 235* 142*  BUN 61* 52* 54* 59* 44*  CREATININE 1.87* 1.50* 1.25* 1.35* 1.45*  CALCIUM 8.2* 8.1* 8.3* 8.3* 8.5*  MG  --   --  2.4 2.3 2.3   GFR: Estimated Creatinine Clearance: 39.6 mL/min (A) (by C-G formula based on SCr of 1.45 mg/dL (H)). Liver Function  Tests: Recent Labs  Lab 07/21/20 2311 07/25/20 0544  AST 33 19  ALT 14 10  ALKPHOS 53 35*  BILITOT 1.1 0.6  PROT 6.3* 5.2*  ALBUMIN 2.7* 2.3*   Recent Labs  Lab 07/21/20 2311  LIPASE 20   No results for input(s): AMMONIA in the last 168 hours. Coagulation Profile: No results for input(s): INR, PROTIME in the last 168 hours. Cardiac Enzymes: No results for input(s): CKTOTAL, CKMB, CKMBINDEX, TROPONINI in the last 168 hours. BNP (last 3 results) No results for input(s): PROBNP in the last 8760 hours. HbA1C: No results for input(s): HGBA1C in the last 72 hours. CBG: Recent Labs  Lab 07/22/20 1412 07/22/20 1727 07/23/20 0836 07/23/20 1533  GLUCAP 144* 122* 81 131*   Lipid Profile: No results for input(s): CHOL, HDL, LDLCALC, TRIG, CHOLHDL, LDLDIRECT in the last 72 hours. Thyroid Function Tests: No results for input(s): TSH, T4TOTAL, FREET4, T3FREE, THYROIDAB in the last 72 hours. Anemia Panel: Recent Labs    07/25/20 0544 07/25/20 1019  VITAMINB12 1,868*  --   TIBC 137*  --   IRON 109  --   RETICCTPCT  --  1.1   Sepsis Labs: Recent Labs  Lab 07/22/20 1102 07/26/20 0610  PROCALCITON 33.44 5.96    Recent Results (from the past 240 hour(s))  Respiratory Panel by RT PCR (Flu A&B, Covid) - Nasopharyngeal Swab     Status: None   Collection Time: 07/21/20  8:48 PM   Specimen: Nasopharyngeal Swab  Result Value Ref Range Status   SARS Coronavirus 2 by RT PCR NEGATIVE NEGATIVE Final    Comment:  (NOTE) SARS-CoV-2 target nucleic acids are NOT DETECTED.  The SARS-CoV-2 RNA is generally detectable in upper respiratoy specimens during the acute phase of infection. The lowest concentration of SARS-CoV-2 viral copies this assay can detect is 131 copies/mL. A negative result does not preclude SARS-Cov-2 infection and should not be used as the sole basis for treatment or other patient management decisions. A negative result may occur with  improper specimen collection/handling, submission of specimen other than nasopharyngeal swab, presence of viral mutation(s) within the areas targeted by this assay, and inadequate number of viral copies (<131 copies/mL). A negative result must be combined with clinical observations, patient history, and epidemiological information. The expected result is Negative.  Fact Sheet for Patients:  https://www.moore.com/  Fact Sheet for Healthcare Providers:  https://www.young.biz/  This test is no t yet approved or cleared by the [place] FDA and  has been authorized for detection and/or diagnosis of SARS-CoV-2 by FDA under an Emergency Use Authorization (EUA). This EUA will remain  in effect (meaning this test can be used) for the duration of the COVID-19 declaration under Section 564(b)(1) of the Act, 21 U.S.C. section 360bbb-3(b)(1), unless the authorization is terminated or revoked sooner.     Influenza A by PCR NEGATIVE NEGATIVE Final   Influenza B by PCR NEGATIVE NEGATIVE Final    Comment: (NOTE) The Xpert Xpress SARS-CoV-2/FLU/RSV assay is intended as an aid in  the diagnosis of influenza from Nasopharyngeal swab specimens and  should not be used as a sole basis for treatment. Nasal washings and  aspirates are unacceptable for Xpert Xpress SARS-CoV-2/FLU/RSV  testing.  Fact Sheet for Patients: https://www.moore.com/  Fact Sheet for Healthcare  Providers: https://www.young.biz/  This test is not yet approved or cleared by the [place] FDA and  has been authorized for detection and/or diagnosis of SARS-CoV-2 by  FDA under an Emergency Use Authorization (  EUA). This EUA will remain  in effect (meaning this test can be used) for the duration of the  Covid-19 declaration under Section 564(b)(1) of the Act, 21  U.S.C. section 360bbb-3(b)(1), unless the authorization is  terminated or revoked. Performed at [facility], [address], [place], [place] [address]   Urine culture     Status: Abnormal   Collection Time: 07/21/20  8:48 PM   Specimen: Urine, Random  Result Value Ref Range Status   Specimen Description   Final    URINE, RANDOM Performed at [facility], [address] [place], [place] [address]    Special Requests   Final    NONE Performed at [facility], [address] [place], [place] [address]    Culture >=100,000 COLONIES/mL ESCHERICHIA Dominguez (A)  Final   Report Status 07/23/2020 FINAL  Final   Organism ID, Bacteria ESCHERICHIA Dominguez (A)  Final      Susceptibility   Escherichia Dominguez - MIC*    AMPICILLIN 8 SENSITIVE Sensitive     CEFAZOLIN <=4 SENSITIVE Sensitive     CEFEPIME <=0.12 SENSITIVE Sensitive     CEFTRIAXONE <=0.25 SENSITIVE Sensitive     CIPROFLOXACIN <=0.25 SENSITIVE Sensitive     GENTAMICIN <=1 SENSITIVE Sensitive     IMIPENEM <=0.25 SENSITIVE Sensitive     NITROFURANTOIN <=16 SENSITIVE Sensitive     TRIMETH/SULFA <=20 SENSITIVE Sensitive     AMPICILLIN/SULBACTAM 4 SENSITIVE Sensitive     PIP/TAZO <=4 SENSITIVE Sensitive     * >=100,000 COLONIES/mL ESCHERICHIA Dominguez  CULTURE, BLOOD (ROUTINE X 2) w Reflex to ID Panel     Status: None (Preliminary result)   Collection Time: 07/22/20 11:02 AM   Specimen: BLOOD  Result Value Ref Range Status   Specimen Description BLOOD RIGHT ANTECUBITAL  Final   Special Requests   Final    BOTTLES DRAWN  AEROBIC ONLY Blood Culture adequate volume   Culture   Final    NO GROWTH 4 DAYS Performed at [facility], [address], [place], [place] [address]    Report Status PENDING  Incomplete  CULTURE, BLOOD (ROUTINE X 2) w Reflex to ID Panel     Status: None (Preliminary result)   Collection Time: 07/22/20 11:03 AM   Specimen: BLOOD  Result Value Ref Range Status   Specimen Description BLOOD BLOOD RIGHT HAND  Final   Special Requests   Final    BOTTLES DRAWN AEROBIC ONLY Blood Culture results may not be optimal due to an inadequate volume of blood received in culture bottles   Culture   Final    NO GROWTH 4 DAYS Performed at [facility], [address] [place], [place] [address]    Report Status PENDING  Incomplete  MRSA PCR Screening     Status: Abnormal   Collection Time: 07/22/20  9:00 PM   Specimen: Nasopharyngeal  Result Value Ref Range Status   MRSA by PCR POSITIVE (A) NEGATIVE Final    Comment:        The GeneXpert MRSA Assay (FDA approved for NASAL specimens only), is one component of a comprehensive MRSA colonization surveillance program. It is not intended to diagnose MRSA infection nor to guide or monitor treatment for MRSA infections. RESULT CALLED TO, READ BACK BY AND VERIFIED WITH: Zettie Cooley RN [id number] 07/22/20 HNM Performed at [facility], [address] [place], [place] [address]          Radiology Studies: CT ABDOMEN  PELVIS W CONTRAST  Result Date: 07/25/2020 CLINICAL DATA:  Drop in hemoglobin, retroperitoneal hematoma suspected. EXAM: CT ABDOMEN AND PELVIS WITH CONTRAST TECHNIQUE: Multidetector CT imaging of the abdomen and pelvis was performed using the standard protocol following bolus administration of intravenous contrast. CONTRAST:  50mL OMNIPAQUE IOHEXOL 300 MG/ML  SOLN COMPARISON:  Recent prior abdominal radiographs.  CT 08/02/2017 FINDINGS: Lower chest: Bilateral lower lobe consolidations with trace pleural  effusions, left greater than right. Coronary artery calcifications. Hepatobiliary: No focal hepatic abnormality. No perihepatic fluid. Gallbladder is not well visualized, suspect completely decompressed. There is no evidence of biliary dilatation. Pancreas: No ductal dilatation or inflammation. Spleen: Normal in size without focal abnormality. No evidence of splenic injury or perisplenic hematoma. Adrenals/Urinary Tract: No adrenal nodule or hemorrhage. No hydronephrosis. Mild right renal atrophy which is increased from 2019 exam. No perinephric edema. Parapelvic cyst in the mid lower left kidney is unchanged from prior exam. There is symmetric excretion on delayed phase imaging. Minimal bladder wall thickening but no evidence of bladder hemorrhage. 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 This was seen on prior exam. There is minimal distal esophageal wall thickening. Ingested material within the stomach. The duodenum stays to the right of midline in keeping with bowel malrotation. There is some fluid-filled loops of small bowel in the lower abdomen and pelvis but no evidence of bowel inflammation or mesenteric edema. Cecum appears to be located in the upper central abdomen. Moderate volume of stool in the colon. There is high density material in the rectosigmoid colon. Rectum is distended. No significant colonic inflammation. Vascular/Lymphatic: Aorto bi-iliac atherosclerosis. No aortic aneurysm. There is no evidence of acute aortic abnormality or vasculitis. No retroperitoneal fluid or hemorrhage. No retroperitoneal stranding. Portal vein and mesenteric vessels are patent. No abdominopelvic adenopathy. Reproductive: Prostate is unremarkable. There are bilateral large hydroceles, partially included. Other: No free fluid or evidence of hemoperitoneum. There is no retroperitoneal fluid or hemorrhage. No free air. Mild generalized body wall edema.  Musculoskeletal: There are no acute or suspicious osseous abnormalities. Degenerative change throughout the spine. IMPRESSION: 1. No evidence of retroperitoneal hematoma or intra-abdominal bleed in the abdomen/pelvis. 2. Bilateral lower lobe consolidations with trace pleural effusions, left greater than right, suspicious for pneumonia. 3. 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 This was seen on prior exam. There are prominent fluid-filled loops of small bowel suggesting ileus. No evidence of bowel inflammation or high-grade obstruction. 4. Progressive right renal atrophy since 2018. 5. Bilateral large hydroceles, partially included. Aortic Atherosclerosis (ICD10-I70.0). Electronically Signed   By: Narda Rutherford M.D.   On: 07/25/2020 18:44        Scheduled Meds: . amoxicillin-clavulanate  1 tablet Oral Q12H  . mupirocin ointment  1 application Nasal BID  . pantoprazole (PROTONIX) IV  40 mg Intravenous Q12H  . senna-docusate  2 tablet Oral BID   Continuous Infusions: . dextrose 5 % with KCl 20 mEq / L Stopped (07/26/20 0735)     LOS: 4 days    Time spent: 28 minutes    Marrion Coy, MD [facility]   To contact the attending provider between 7A-7P or the covering provider during after hours 7P-7A, please log into the web site [email or web address] and access using universal [place] password for that web site. If you do not have the password, please call the hospital operator.  07/26/2020, 2:00 PM

## 2020-07-26 NOTE — Progress Notes (Signed)
Fecal Hemoccult sent off at 0700; results are negative.

## 2020-07-26 NOTE — Care Management Important Message (Signed)
Important Message  Patient Details  Name: Elijah Dominguez MRN: [id number] Date of Birth: 12-Mar-1936   Medicare Important Message Given:  Yes     Olegario Messier A Katianne Barre 07/26/2020, 11:05 AM

## 2020-07-26 NOTE — Progress Notes (Signed)
Midge Minium, MD [facility]   [address], [address] [place], [place] [address] Phone: [phone] Fax : [phone]   Subjective: This patient had a drop in hemoglobin to 4.8 and presumed a possible GI bleed since the patient CT scan did not show any sign of bleeding.  The patient had received 3 units and corrected to 9.6.  The patient had his stool sent off for Hemoccult which was negative.   Objective: Vital signs in last 24 hours: Vitals:   07/26/20 0126 07/26/20 0510 07/26/20 0752 07/26/20 1225  BP: (!) 123/59 139/71 (!) 154/67 (!) 141/64  Pulse: (!) 57 86 72 70  Resp: 16 16 16 18   Temp: (!) 97.3 F (36.3 C) 98.8 F (37.1 C) 99.3 F (37.4 C) 99.4 F (37.4 C)  TempSrc: Oral Oral Oral Oral  SpO2: (!) 87% (!) 86%    Weight:      Height:       Weight change:   Intake/Output Summary (Last 24 hours) at 07/26/2020 1245 Last data filed at 07/26/2020 0809 Gross per 24 hour  Intake 1518.04 ml  Output --  Net 1518.04 ml     Exam: Heart:: Regular rate and rhythm, S1S2 present or without murmur or extra heart sounds Lungs: normal and clear to auscultation and percussion Abdomen: soft, nontender, normal bowel sounds   Lab Results: @LABTEST2 @ Micro Results: Recent Results (from the past 240 hour(s))  Respiratory Panel by RT PCR (Flu A&B, Covid) - Nasopharyngeal Swab     Status: None   Collection Time: 07/21/20  8:48 PM   Specimen: Nasopharyngeal Swab  Result Value Ref Range Status   SARS Coronavirus 2 by RT PCR NEGATIVE NEGATIVE Final    Comment: (NOTE) SARS-CoV-2 target nucleic acids are NOT DETECTED.  The SARS-CoV-2 RNA is generally detectable in upper respiratoy specimens during the acute phase of infection. The lowest concentration of SARS-CoV-2 viral copies this assay can detect is 131 copies/mL. A negative result does not preclude SARS-Cov-2 infection and should not be used as the sole basis for treatment or other patient management decisions. A negative result may  occur with  improper specimen collection/handling, submission of specimen other than nasopharyngeal swab, presence of viral mutation(s) within the areas targeted by this assay, and inadequate number of viral copies (<131 copies/mL). A negative result must be combined with clinical observations, patient history, and epidemiological information. The expected result is Negative.  Fact Sheet for Patients:   Fact Sheet for Healthcare Providers:  07/23/20  This test is no t yet approved or cleared by the https://www.moore.com/ FDA and  has been authorized for detection and/or diagnosis of SARS-CoV-2 by FDA under an Emergency Use Authorization (EUA). This EUA will remain  in effect (meaning this test can be used) for the duration of the COVID-19 declaration under Section 564(b)(1) of the Act, 21 U.S.C. section 360bbb-3(b)(1), unless the authorization is terminated or revoked sooner.     Influenza A by PCR NEGATIVE NEGATIVE Final   Influenza B by PCR NEGATIVE NEGATIVE Final    Comment: (NOTE) The Xpert Xpress SARS-CoV-2/FLU/RSV assay is intended as an aid in  the diagnosis of influenza from Nasopharyngeal swab specimens and  should not be used as a sole basis for treatment. Nasal washings and  aspirates are unacceptable for Xpert Xpress SARS-CoV-2/FLU/RSV  testing.  Fact Sheet for Patients: https://www.young.biz/  Fact Sheet for Healthcare Providers: [place]  This test is not yet approved or cleared by the https://www.moore.com/ FDA and  has  been authorized for detection and/or diagnosis of SARS-CoV-2 by  FDA under an Emergency Use Authorization (EUA). This EUA will remain  in effect (meaning this test can be used) for the duration of the  Covid-19 declaration under Section 564(b)(1) of the Act, 21  U.S.C. section 360bbb-3(b)(1), unless the authorization is  terminated or  revoked. Performed at [facility], [address] [place], [place] [address]   Urine culture     Status: Abnormal   Collection Time: 07/21/20  8:48 PM   Specimen: Urine, Random  Result Value Ref Range Status   Specimen Description   Final    URINE, RANDOM Performed at [facility], [address] [place], [place] [address]    Special Requests   Final    NONE Performed at [facility], [address] [place], [place] [address]    Culture >=100,000 COLONIES/mL ESCHERICHIA COLI (A)  Final   Report Status 07/23/2020 FINAL  Final   Organism ID, Bacteria ESCHERICHIA COLI (A)  Final      Susceptibility   Escherichia coli - MIC*    AMPICILLIN 8 SENSITIVE Sensitive     CEFAZOLIN <=4 SENSITIVE Sensitive     CEFEPIME <=0.12 SENSITIVE Sensitive     CEFTRIAXONE <=0.25 SENSITIVE Sensitive     CIPROFLOXACIN <=0.25 SENSITIVE Sensitive     GENTAMICIN <=1 SENSITIVE Sensitive     IMIPENEM <=0.25 SENSITIVE Sensitive     NITROFURANTOIN <=16 SENSITIVE Sensitive     TRIMETH/SULFA <=20 SENSITIVE Sensitive     AMPICILLIN/SULBACTAM 4 SENSITIVE Sensitive     PIP/TAZO <=4 SENSITIVE Sensitive     * >=100,000 COLONIES/mL ESCHERICHIA COLI  CULTURE, BLOOD (ROUTINE X 2) w Reflex to ID Panel     Status: None (Preliminary result)   Collection Time: 07/22/20 11:02 AM   Specimen: BLOOD  Result Value Ref Range Status   Specimen Description BLOOD RIGHT ANTECUBITAL  Final   Special Requests   Final    BOTTLES DRAWN AEROBIC ONLY Blood Culture adequate volume   Culture   Final    NO GROWTH 4 DAYS Performed at [facility], [address] [place], [place] [address]    Report Status PENDING  Incomplete  CULTURE, BLOOD (ROUTINE X 2) w Reflex to ID Panel     Status: None (Preliminary result)   Collection Time: 07/22/20 11:03 AM   Specimen: BLOOD  Result Value Ref Range Status   Specimen Description BLOOD BLOOD RIGHT HAND  Final   Special Requests   Final    BOTTLES  DRAWN AEROBIC ONLY Blood Culture results may not be optimal due to an inadequate volume of blood received in culture bottles   Culture   Final    NO GROWTH 4 DAYS Performed at [facility], [address] [place], [place] [address]    Report Status PENDING  Incomplete  MRSA PCR Screening     Status: Abnormal   Collection Time: 07/22/20  9:00 PM   Specimen: Nasopharyngeal  Result Value Ref Range Status   MRSA by PCR POSITIVE (A) NEGATIVE Final    Comment:        The GeneXpert MRSA Assay (FDA approved for NASAL specimens only), is one component of a comprehensive MRSA colonization surveillance program. It is not intended to diagnose MRSA infection nor to guide or monitor treatment for MRSA infections. RESULT CALLED TO, READ BACK BY AND VERIFIED WITH: Zettie CooleyMARY WITHERSPOON RN [id number] 07/22/20 HNM Performed at [facility], [address] [place], [place] [address]    Studies/Results: CT ABDOMEN PELVIS W CONTRAST  Result Date: 07/25/2020 CLINICAL DATA:  Drop in hemoglobin, retroperitoneal hematoma suspected. EXAM: CT ABDOMEN AND PELVIS WITH CONTRAST TECHNIQUE: Multidetector CT imaging of the abdomen and pelvis was performed using the standard protocol following bolus administration of intravenous contrast. CONTRAST:  50mL OMNIPAQUE IOHEXOL 300 MG/ML  SOLN COMPARISON:  Recent prior abdominal radiographs.  CT 08/02/2017 FINDINGS: Lower chest: Bilateral lower lobe consolidations with trace pleural effusions, left greater than right. Coronary artery calcifications. Hepatobiliary: No focal hepatic abnormality. No perihepatic fluid. Gallbladder is not well visualized, suspect completely decompressed. There is no evidence of biliary dilatation. Pancreas: No ductal dilatation or inflammation. Spleen: Normal in size without focal abnormality. No evidence of splenic injury or perisplenic hematoma. Adrenals/Urinary Tract: No adrenal nodule or hemorrhage. No hydronephrosis. Mild right renal  atrophy which is increased from 2019 exam. No perinephric edema. Parapelvic cyst in the mid lower left kidney is unchanged from prior exam. There is symmetric excretion on delayed phase imaging. Minimal bladder wall thickening but no evidence of bladder hemorrhage. 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 This was seen on prior exam. There is minimal distal esophageal wall thickening. Ingested material within the stomach. The duodenum stays to the right of midline in keeping with bowel malrotation. There is some fluid-filled loops of small bowel in the lower abdomen and pelvis but no evidence of bowel inflammation or mesenteric edema. Cecum appears to be located in the upper central abdomen. Moderate volume of stool in the colon. There is high density material in the rectosigmoid colon. Rectum is distended. No significant colonic inflammation. Vascular/Lymphatic: Aorto bi-iliac atherosclerosis. No aortic aneurysm. There is no evidence of acute aortic abnormality or vasculitis. No retroperitoneal fluid or hemorrhage. No retroperitoneal stranding. Portal vein and mesenteric vessels are patent. No abdominopelvic adenopathy. Reproductive: Prostate is unremarkable. There are bilateral large hydroceles, partially included. Other: No free fluid or evidence of hemoperitoneum. There is no retroperitoneal fluid or hemorrhage. No free air. Mild generalized body wall edema. Musculoskeletal: There are no acute or suspicious osseous abnormalities. Degenerative change throughout the spine. IMPRESSION: 1. No evidence of retroperitoneal hematoma or intra-abdominal bleed in the abdomen/pelvis. 2. Bilateral lower lobe consolidations with trace pleural effusions, left greater than right, suspicious for pneumonia. 3. 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 This was seen on prior exam. There are prominent fluid-filled loops of  small bowel suggesting ileus. No evidence of bowel inflammation or high-grade obstruction. 4. Progressive right renal atrophy since 2018. 5. Bilateral large hydroceles, partially included. Aortic Atherosclerosis (ICD10-I70.0). Electronically Signed   By: Narda Rutherford M.D.   On: 07/25/2020 18:44   Medications: I have reviewed the patient's current medications. Scheduled Meds: . amoxicillin-clavulanate  1 tablet Oral Q12H  . mupirocin ointment  1 application Nasal BID  . pantoprazole (PROTONIX) IV  40 mg Intravenous Q12H  . senna-docusate  2 tablet Oral BID   Continuous Infusions: . dextrose 5 % with KCl 20 mEq / L Stopped (07/26/20 0735)   PRN Meds:.acetaminophen **OR** acetaminophen, ondansetron **OR** ondansetron (ZOFRAN) IV   Assessment: Principal Problem:   Severe sepsis with acute organ dysfunction (HCC) Active Problems:   AKI (acute kidney injury) (HCC)   E. coli UTI   Type 2 diabetes mellitus (HCC)   Hypertension associated with diabetes (HCC)   Mixed Alzheimer's and vascular dementia (HCC)   Aspiration pneumonia (  HCC)   SBO (small bowel obstruction) (HCC)   Pressure injury of skin   Severe protein-calorie malnutrition (HCC)   Anemia   Altered mental status    Plan:  This patient had a CT scan without any sign of intra-abdominal bleeding.  It was reported that he does have malrotation.  The patient's stools were Hemoccult negative and with this degree of hemoglobin drop the stools should have been Hemoccult positive.  The patient overcorrected from 4.8 to 9.6 after only 3 units of blood.  The patient is unlikely to take any prep for a colonoscopy and since his heme negative a GI bleed does not appear likely.  I do not recommend any further GI intervention at this time.   LOS: 4 days   Sherlyn Hay 07/26/2020, 12:45 PM Pager [phone] 7am-5pm  Check AMION for 5pm -7am coverage and on weekends

## 2020-07-27 DIAGNOSIS — K921 Melena: Secondary | ICD-10-CM | POA: Diagnosis not present

## 2020-07-27 DIAGNOSIS — Z85038 Personal history of other malignant neoplasm of large intestine: Secondary | ICD-10-CM | POA: Diagnosis not present

## 2020-07-27 DIAGNOSIS — A419 Sepsis, unspecified organism: Secondary | ICD-10-CM | POA: Diagnosis not present

## 2020-07-27 DIAGNOSIS — N390 Urinary tract infection, site not specified: Secondary | ICD-10-CM | POA: Diagnosis not present

## 2020-07-27 DIAGNOSIS — D62 Acute posthemorrhagic anemia: Secondary | ICD-10-CM

## 2020-07-27 DIAGNOSIS — N179 Acute kidney failure, unspecified: Secondary | ICD-10-CM | POA: Diagnosis not present

## 2020-07-27 DIAGNOSIS — K56609 Unspecified intestinal obstruction, unspecified as to partial versus complete obstruction: Secondary | ICD-10-CM | POA: Diagnosis not present

## 2020-07-27 LAB — CBC WITH DIFFERENTIAL/PLATELET
Abs Immature Granulocytes: 1.09 10*3/uL — ABNORMAL HIGH (ref 0.00–0.07)
Basophils Absolute: 0.1 10*3/uL (ref 0.0–0.1)
Basophils Relative: 1 %
Eosinophils Absolute: 0.1 10*3/uL (ref 0.0–0.5)
Eosinophils Relative: 0 %
HCT: 25.4 % — ABNORMAL LOW (ref 39.0–52.0)
Hemoglobin: 9 g/dL — ABNORMAL LOW (ref 13.0–17.0)
Immature Granulocytes: 7 %
Lymphocytes Relative: 9 %
Lymphs Abs: 1.3 10*3/uL (ref 0.7–4.0)
MCH: 26.7 pg (ref 26.0–34.0)
MCHC: 35.4 g/dL (ref 30.0–36.0)
MCV: 75.4 fL — ABNORMAL LOW (ref 80.0–100.0)
Monocytes Absolute: 0.9 10*3/uL (ref 0.1–1.0)
Monocytes Relative: 6 %
Neutro Abs: 11.3 10*3/uL — ABNORMAL HIGH (ref 1.7–7.7)
Neutrophils Relative %: 77 %
Platelets: 181 10*3/uL (ref 150–400)
RBC: 3.37 MIL/uL — ABNORMAL LOW (ref 4.22–5.81)
RDW: 18.6 % — ABNORMAL HIGH (ref 11.5–15.5)
Smear Review: NORMAL
WBC: 14.7 10*3/uL — ABNORMAL HIGH (ref 4.0–10.5)
nRBC: 0.3 % — ABNORMAL HIGH (ref 0.0–0.2)

## 2020-07-27 LAB — MAGNESIUM: Magnesium: 2 mg/dL (ref 1.7–2.4)

## 2020-07-27 LAB — CULTURE, BLOOD (ROUTINE X 2)
Culture: NO GROWTH
Culture: NO GROWTH
Special Requests: ADEQUATE

## 2020-07-27 LAB — BASIC METABOLIC PANEL
Anion gap: 9 (ref 5–15)
BUN: 23 mg/dL (ref 8–23)
CO2: 25 mmol/L (ref 22–32)
Calcium: 8.3 mg/dL — ABNORMAL LOW (ref 8.9–10.3)
Chloride: 107 mmol/L (ref 98–111)
Creatinine, Ser: 1.25 mg/dL — ABNORMAL HIGH (ref 0.61–1.24)
GFR, Estimated: 57 mL/min — ABNORMAL LOW (ref >60)
Glucose, Bld: 153 mg/dL — ABNORMAL HIGH (ref 70–99)
Potassium: 3.1 mmol/L — ABNORMAL LOW (ref 3.5–5.1)
Sodium: 141 mmol/L (ref 135–145)

## 2020-07-27 LAB — HEMOGLOBIN: Hemoglobin: 9.2 g/dL — ABNORMAL LOW (ref 13.0–17.0)

## 2020-07-27 LAB — OCCULT BLOOD X 1 CARD TO LAB, STOOL: Fecal Occult Bld: POSITIVE — AB

## 2020-07-27 MED ORDER — POTASSIUM CHLORIDE 10 MEQ/100ML IV SOLN
10.0000 meq | INTRAVENOUS | Status: AC
  Administered 2020-07-27 (×2): 10 meq via INTRAVENOUS
  Filled 2020-07-27 (×2): qty 100

## 2020-07-27 MED ORDER — PANTOPRAZOLE SODIUM 40 MG IV SOLR
40.0000 mg | Freq: Two times a day (BID) | INTRAVENOUS | Status: DC
  Administered 2020-07-27 – 2020-07-29 (×5): 40 mg via INTRAVENOUS
  Filled 2020-07-27 (×5): qty 40

## 2020-07-27 MED ORDER — POTASSIUM CHLORIDE 2 MEQ/ML IV SOLN: INTRAVENOUS | Status: DC

## 2020-07-27 MED ORDER — KCL IN DEXTROSE-NACL 20-5-0.45 MEQ/L-%-% IV SOLN
INTRAVENOUS | Status: DC
  Filled 2020-07-27 (×4): qty 1000

## 2020-07-27 NOTE — Plan of Care (Signed)
  Problem: Clinical Measurements: Goal: Ability to maintain clinical measurements within normal limits will improve Outcome: Progressing Goal: Will remain free from infection Outcome: Progressing Goal: Diagnostic test results will improve Outcome: Progressing   Problem: Activity: Goal: Risk for activity intolerance will decrease Outcome: Progressing   Problem: Pain Managment: Goal: General experience of comfort will improve Outcome: Progressing   Problem: Safety: Goal: Ability to remain free from injury will improve Outcome: Progressing   Problem: Urinary Elimination: Goal: Signs and symptoms of infection will decrease Outcome: Progressing

## 2020-07-27 NOTE — Progress Notes (Addendum)
Elijah Bouillon, MD [address], [place], [place], [address] [address], [place], [place], [address] Phone: [phone]  Fax: [phone]   Subjective: Patient unable to provide reliable history due to dementia.  Patient was being followed by Dr. Servando Snare over the week and he signed off.  I was called again today by Dr. Chipper Herb due to melena noted yesterday.  Patient's nurse reports no further bowel movements since morning shift.  Flowsheets have documented large black bowel movements early this morning around 3 AM and 7 AM.  No nausea or vomiting.  Patient hemodynamically stable  Objective: Exam: Vital signs in last 24 hours: Vitals:   07/26/20 1923 07/26/20 2340 07/27/20 0520 07/27/20 0945  BP: (!) 154/67 (!) 154/75 (!) 153/70 (!) 160/78  Pulse: 71 73 73 80  Resp: 16 17 16  (!) 22  Temp: 98.9 F (37.2 C) 98.9 F (37.2 C) 97.9 F (36.6 C) 98 F (36.7 C)  TempSrc: Oral Oral Oral   SpO2: 100% 100% 100% 99%  Weight:      Height:       Weight change:   Intake/Output Summary (Last 24 hours) at 07/27/2020 1125 Last data filed at 07/26/2020 1924 Gross per 24 hour  Intake 492.4 ml  Output --  Net 492.4 ml    General: No acute distress, not oriented to place or time Abd: Soft, NT/ND, No HSM Skin: Warm, no rashes Neck: Supple, Trachea midline   Lab Results: Lab Results  Component Value Date   WBC 14.7 (H) 07/27/2020   HGB 9.0 (L) 07/27/2020   HCT 25.4 (L) 07/27/2020   MCV 75.4 (L) 07/27/2020   PLT 181 07/27/2020   Micro Results: Recent Results (from the past 240 hour(s))  Respiratory Panel by RT PCR (Flu A&B, Covid) - Nasopharyngeal Swab     Status: None   Collection Time: 07/21/20  8:48 PM   Specimen: Nasopharyngeal Swab  Result Value Ref Range Status   SARS Coronavirus 2 by RT PCR NEGATIVE NEGATIVE Final    Comment: (NOTE) SARS-CoV-2 target nucleic acids are NOT DETECTED.  The SARS-CoV-2 RNA is generally detectable in upper  respiratoy specimens during the acute phase of infection. The lowest concentration of SARS-CoV-2 viral copies this assay can detect is 131 copies/mL. A negative result does not preclude SARS-Cov-2 infection and should not be used as the sole basis for treatment or other patient management decisions. A negative result may occur with  improper specimen collection/handling, submission of specimen other than nasopharyngeal swab, presence of viral mutation(s) within the areas targeted by this assay, and inadequate number of viral copies (<131 copies/mL). A negative result must be combined with clinical observations, patient history, and epidemiological information. The expected result is Negative.  Fact Sheet for Patients:  07/23/20  Fact Sheet for Healthcare Providers:  https://www.moore.com/  This test is no t yet approved or cleared by the https://www.young.biz/ FDA and  has been authorized for detection and/or diagnosis of SARS-CoV-2 by FDA under an Emergency Use Authorization (EUA). This EUA will remain  in effect (meaning this test can be used) for the duration of the COVID-19 declaration under Section 564(b)(1) of the Act, 21 U.S.C. section 360bbb-3(b)(1), unless the authorization is terminated or revoked sooner.     Influenza A by PCR NEGATIVE NEGATIVE Final   Influenza B by PCR NEGATIVE NEGATIVE Final    Comment: (NOTE) The Xpert Xpress SARS-CoV-2/FLU/RSV assay is intended as an aid in  the diagnosis of influenza from  Nasopharyngeal swab specimens and  should not be used as a sole basis for treatment. Nasal washings and  aspirates are unacceptable for Xpert Xpress SARS-CoV-2/FLU/RSV  testing.  Fact Sheet for Patients: https://www.moore.com/https://www.fda.gov/media/142436/download  Fact Sheet for Healthcare Providers: https://www.young.biz/https://www.fda.gov/media/142435/download  This test is not yet approved or cleared by the [place] FDA and  has been  authorized for detection and/or diagnosis of SARS-CoV-2 by  FDA under an Emergency Use Authorization (EUA). This EUA will remain  in effect (meaning this test can be used) for the duration of the  Covid-19 declaration under Section 564(b)(1) of the Act, 21  U.S.C. section 360bbb-3(b)(1), unless the authorization is  terminated or revoked. Performed at [facility], [address] [place], [place] [address]   Urine culture     Status: Abnormal   Collection Time: 07/21/20  8:48 PM   Specimen: Urine, Random  Result Value Ref Range Status   Specimen Description   Final    URINE, RANDOM Performed at [facility], [address] [place], [place] [address]    Special Requests   Final    NONE Performed at [facility], [address] [place], [place] [address]    Culture >=100,000 COLONIES/mL ESCHERICHIA COLI (A)  Final   Report Status 07/23/2020 FINAL  Final   Organism ID, Bacteria ESCHERICHIA COLI (A)  Final      Susceptibility   Escherichia coli - MIC*    AMPICILLIN 8 SENSITIVE Sensitive     CEFAZOLIN <=4 SENSITIVE Sensitive     CEFEPIME <=0.12 SENSITIVE Sensitive     CEFTRIAXONE <=0.25 SENSITIVE Sensitive     CIPROFLOXACIN <=0.25 SENSITIVE Sensitive     GENTAMICIN <=1 SENSITIVE Sensitive     IMIPENEM <=0.25 SENSITIVE Sensitive     NITROFURANTOIN <=16 SENSITIVE Sensitive     TRIMETH/SULFA <=20 SENSITIVE Sensitive     AMPICILLIN/SULBACTAM 4 SENSITIVE Sensitive     PIP/TAZO <=4 SENSITIVE Sensitive     * >=100,000 COLONIES/mL ESCHERICHIA COLI  CULTURE, BLOOD (ROUTINE X 2) w Reflex to ID Panel     Status: None   Collection Time: 07/22/20 11:02 AM   Specimen: BLOOD  Result Value Ref Range Status   Specimen Description BLOOD RIGHT ANTECUBITAL  Final   Special Requests   Final    BOTTLES DRAWN AEROBIC ONLY Blood Culture adequate volume   Culture   Final    NO GROWTH 5 DAYS Performed at [facility], [address] [place], [place]  [address]    Report Status 07/27/2020 FINAL  Final  CULTURE, BLOOD (ROUTINE X 2) w Reflex to ID Panel     Status: None   Collection Time: 07/22/20 11:03 AM   Specimen: BLOOD  Result Value Ref Range Status   Specimen Description BLOOD BLOOD RIGHT HAND  Final   Special Requests   Final    BOTTLES DRAWN AEROBIC ONLY Blood Culture results may not be optimal due to an inadequate volume of blood received in culture bottles   Culture   Final    NO GROWTH 5 DAYS Performed at [facility], [address] [place], [place] [address]    Report Status 07/27/2020 FINAL  Final  MRSA PCR Screening     Status: Abnormal   Collection Time: 07/22/20  9:00 PM   Specimen: Nasopharyngeal  Result Value Ref Range Status   MRSA by PCR POSITIVE (A) NEGATIVE Final    Comment:        The GeneXpert MRSA Assay (  FDA approved for NASAL specimens only), is one component of a comprehensive MRSA colonization surveillance program. It is not intended to diagnose MRSA infection nor to guide or monitor treatment for MRSA infections. RESULT CALLED TO, READ BACK BY AND VERIFIED WITH: Zettie Cooley RN [id number] 07/22/20 HNM Performed at [facility], [address] [place], [place] [address]    Studies/Results: CT ABDOMEN PELVIS W CONTRAST  Result Date: 07/25/2020 CLINICAL DATA:  Drop in hemoglobin, retroperitoneal hematoma suspected. EXAM: CT ABDOMEN AND PELVIS WITH CONTRAST TECHNIQUE: Multidetector CT imaging of the abdomen and pelvis was performed using the standard protocol following bolus administration of intravenous contrast. CONTRAST:  54mL OMNIPAQUE IOHEXOL 300 MG/ML  SOLN COMPARISON:  Recent prior abdominal radiographs.  CT 08/02/2017 FINDINGS: Lower chest: Bilateral lower lobe consolidations with trace pleural effusions, left greater than right. Coronary artery calcifications. Hepatobiliary: No focal hepatic abnormality. No perihepatic fluid. Gallbladder is not well visualized, suspect  completely decompressed. There is no evidence of biliary dilatation. Pancreas: No ductal dilatation or inflammation. Spleen: Normal in size without focal abnormality. No evidence of splenic injury or perisplenic hematoma. Adrenals/Urinary Tract: No adrenal nodule or hemorrhage. No hydronephrosis. Mild right renal atrophy which is increased from 2019 exam. No perinephric edema. Parapelvic cyst in the mid lower left kidney is unchanged from prior exam. There is symmetric excretion on delayed phase imaging. Minimal bladder wall thickening but no evidence of bladder hemorrhage. 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 This was seen on prior exam. There is minimal distal esophageal wall thickening. Ingested material within the stomach. The duodenum stays to the right of midline in keeping with bowel malrotation. There is some fluid-filled loops of small bowel in the lower abdomen and pelvis but no evidence of bowel inflammation or mesenteric edema. Cecum appears to be located in the upper central abdomen. Moderate volume of stool in the colon. There is high density material in the rectosigmoid colon. Rectum is distended. No significant colonic inflammation. Vascular/Lymphatic: Aorto bi-iliac atherosclerosis. No aortic aneurysm. There is no evidence of acute aortic abnormality or vasculitis. No retroperitoneal fluid or hemorrhage. No retroperitoneal stranding. Portal vein and mesenteric vessels are patent. No abdominopelvic adenopathy. Reproductive: Prostate is unremarkable. There are bilateral large hydroceles, partially included. Other: No free fluid or evidence of hemoperitoneum. There is no retroperitoneal fluid or hemorrhage. No free air. Mild generalized body wall edema. Musculoskeletal: There are no acute or suspicious osseous abnormalities. Degenerative change throughout the spine. IMPRESSION: 1. No evidence of retroperitoneal hematoma or intra-abdominal  bleed in the abdomen/pelvis. 2. Bilateral lower lobe consolidations with trace pleural effusions, left greater than right, suspicious for pneumonia. 3. 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 This was seen on prior exam. There are prominent fluid-filled loops of small bowel suggesting ileus. No evidence of bowel inflammation or high-grade obstruction. 4. Progressive right renal atrophy since 2018. 5. Bilateral large hydroceles, partially included. Aortic Atherosclerosis (ICD10-I70.0). Electronically Signed   By: Narda Rutherford M.D.   On: 07/25/2020 18:44   Medications:  Scheduled Meds: . amoxicillin-clavulanate  1 tablet Oral Q12H  . heparin  5,000 Units Subcutaneous Q8H  . mupirocin ointment  1 application Nasal BID  . pantoprazole (PROTONIX) IV  40 mg Intravenous Q12H   Continuous Infusions: . dextrose 5 % and 0.45 % NaCl with KCl 20 mEq/L     PRN Meds:.acetaminophen **OR** acetaminophen, ondansetron **OR** ondansetron (ZOFRAN) IV   Assessment:  Principal Problem:   Severe sepsis with acute organ dysfunction (HCC) Active Problems:   AKI (acute kidney injury) (HCC)   E. coli UTI   Type 2 diabetes mellitus (HCC)   Hypertension associated with diabetes (HCC)   Mixed Alzheimer's and vascular dementia (HCC)   Aspiration pneumonia (HCC)   SBO (small bowel obstruction) (HCC)   Pressure injury of skin   Severe protein-calorie malnutrition (HCC)   Anemia   Altered mental status   Acute blood loss anemia    Plan: Due to melena documented overnight, and presentation with acute microcytic anemia, further evaluation is indicated via endoscopy  Given melena, would recommend proceeding with upper endoscopy first and rule out any upper GI lesions.  If negative, would recommend colonoscopy on subsequent day.  Given his dementia, he may have a difficult time completing the colonoscopy prep, so therefore instead of scheduling both procedures  together, would be more prudent to do them one at a time on subsequent days  I was able to review his records in provation.  Patient initially had a colonoscopy 2007 for iron deficiency anemia and diagnosed with a large ascending colon mass by Dr. Marva Panda.  Biopsies were obtained.  Pathology report not available.  Subsequent to this he had a colonoscopy in 2008, with indication, history of colon cancer, that reports right colon anastomosis and a small mass found at the anastomosis site that was biopsied.  Again pathology report not available.  Images reviewed personally.  The most recent colonoscopy was in 2009 that showed granulation tissue at the anastomosis site that was also biopsied.  Again pathology report not available.  Internal hemorrhoids were reported.  Upper endoscopies in 2007 showed gastric edema, esophagitis, hiatal hernia, gastric erosion and 2009 showed gastric erythema, hiatal hernia  I have placed his procedure orders for tomorrow  Given his history of colon cancer if upper endoscopy does not show a reason for his anemia, he would benefit from subsequent colonoscopy  PPI IV twice daily  Continue serial CBCs and transfuse PRN Avoid NSAIDs Maintain 2 large-bore IV lines Please page GI with any acute hemodynamic changes, or signs of active GI bleeding    LOS: 5 days   Elijah Bouillon, MD 07/27/2020, 11:25 AM

## 2020-07-27 NOTE — Progress Notes (Signed)
Messaged MD in regards to heparin ordered to be administered. Awaiting response. Bo Mcclintock, RN

## 2020-07-27 NOTE — Progress Notes (Signed)
PROGRESS NOTE    Elijah Dominguez  EXB:[id number] DOB: Nov 14, 1935 DOA: 07/21/2020 PCP: Barbette Reichmann, MD   Chief complaint.  Shortness of breath. Brief Narrative:  Elijah Dominguez a 83 y.o.malewith medical history significant formixed Alzheimer's and vascular dementia, HTN, T2DM, history of CVA, BPH s/p laser TURP who presents to the ED for evaluation ofnausea and vomiting.  Per daughter, patient has dementia with confusion at baseline and has been nonambulatory for the last month. She says he has been eating well but has been losing weight. Before admission,he began to have new onset of nausea and vomiting. In the emergency room, patient was a found to have secondary hypoxemia, was placed on 4 L oxygen. Chest x-ray showed bilateral atelectasis/infiltrates, right greater than left. Her KUB showed small bowel obstruction.Patient is placed on IV antibiotics with Unasyn and vancomycin for aspiration pneumonia.  11/4.Vancomycin was discontinued on 11/3. Patient hemoglobin dropped down to 4.8 today, transfusing 3 units PRBC. Check stool for occult blood, also work-up for hemolytic anemia. Blood culture came back negative, urine culture positive for E. Coli. 11/5.  CT scan did not show any intra-abdominal bleeding, stool occult blood negative, adequate B12 and iron level.  Patient does not have active bleeding.   Assessment & Plan:   Principal Problem:   Severe sepsis with acute organ dysfunction (HCC) Active Problems:   AKI (acute kidney injury) (HCC)   E. coli UTI   Type 2 diabetes mellitus (HCC)   Hypertension associated with diabetes (HCC)   Mixed Alzheimer's and vascular dementia (HCC)   Aspiration pneumonia (HCC)   SBO (small bowel obstruction) (HCC)   Pressure injury of skin   Severe protein-calorie malnutrition (HCC)   Anemia   Altered mental status  #1.  Severe sepsis secondary to E. coli urinary tract infection and aspiration pneumonia. Condition had  improved.  Patient is more stable today.  2.  Acute blood loss anemia on chronic anemia. Patient had more tarry stool last night, rechecked occult blood was positive.  Restart IV Protonix.  Also discussed with GI to consider for EGD. Patient did not have additional bowel movement since midnight.  Hemoglobin has been stable, recheck hemoglobin tomorrow.  3.  Right lower lobe aspiration pneumonia Continue Augmentin.  4.  E. coli urinary tract infection. Continue Augmentin.  5.  Small bowel obstruction. Condition resolved  6.  Acute kidney injury on chronic kidney disease stage III BP Renal function is stabilized.  7.  Hypernatremia with hypokalemia. Sodium level has normalized.  Still has low potassium, will give additional supplements.  Change fluids to D5 half-normal saline with added potassium.  8.  Mixed Alzheimer's and vascular dementia.    DVT prophylaxis: Heparin SQ Code Status: DNR Family Communication: daughter updated Disposition Plan:  .   Status is: Inpatient  Remains inpatient appropriate because:Inpatient level of care appropriate due to severity of illness   Dispo: The patient is from: Home              Anticipated d/c is to: Home              Anticipated d/c date is: 2 days              Patient currently is not medically stable to d/c.        I/O last 3 completed shifts: In: 1217.3 [P.O.:240; I.V.:564; Blood:313.3; IV Piggyback:100] Out: -  No intake/output data recorded.     Consultants:   gi  Procedures: None  Antimicrobials: Augmentin  Subjective: Patient had a multiple bowel movement yesterday evening, become tarry, occult blood was sent out was positive.  No additional bowel movement since midnight. Patient denies any dizziness or headache. Denies any short of breath or cough. No fever or chills. No dysuria hematuria.  Objective: Vitals:   07/26/20 1535 07/26/20 1923 07/26/20 2340 07/27/20 0520  BP: (!) 145/80 (!) 154/67 (!)  154/75 (!) 153/70  Pulse: 72 71 73 73  Resp: 18 16 17 16   Temp: 99.3 F (37.4 C) 98.9 F (37.2 C) 98.9 F (37.2 C) 97.9 F (36.6 C)  TempSrc: Oral Oral Oral Oral  SpO2: (!) 89% 100% 100% 100%  Weight:      Height:        Intake/Output Summary (Last 24 hours) at 07/27/2020 0933 Last data filed at 07/26/2020 1924 Gross per 24 hour  Intake 492.4 ml  Output --  Net 492.4 ml   Filed Weights   07/22/20 0847  Weight: 71.2 kg    Examination:  General exam: Appears calm and comfortable  Respiratory system: Clear to auscultation. Respiratory effort normal. Cardiovascular system: S1 & S2 heard, RRR. No JVD, murmurs, rubs, gallops or clicks. No pedal edema. Gastrointestinal system: Abdomen is nondistended, soft and nontender. No organomegaly or masses felt. Normal bowel sounds heard. Central nervous system: Alert and oriented x1. No focal neurological deficits. Extremities: Symmetric Skin: No rashes, lesions or ulcers Psychiatry: Mood & affect appropriate.     Data Reviewed: I have personally reviewed following labs and imaging studies  CBC: Recent Labs  Lab 07/21/20 2048 07/21/20 2048 07/22/20 1102 07/23/20 0838 07/24/20 0436 07/24/20 0436 07/25/20 0544 07/25/20 0544 07/25/20 1221 07/25/20 1221 07/26/20 0028 07/26/20 0610 07/26/20 1213 07/27/20 0054 07/27/20 0602  WBC 11.5*   < > 10.5  --  14.6*  --  16.0*  --   --   --   --  15.9*  --   --  14.7*  NEUTROABS 9.4*  --   --   --  12.8*  --  13.4*  --   --   --   --  12.7*  --   --  11.3*  HGB 11.4*   < > 9.3*   < > 8.0*   < > 5.6*   < > 4.8*   < > 9.6* 9.6* 9.8* 9.2* 9.0*  HCT 35.3*   < > 27.9*   < > 23.1*   < > 16.6*   < > 14.5*  --  28.5* 28.0* 29.1*  --  25.4*  MCV 73.4*   < > 69.9*  --  70.0*  --  70.6*  --   --   --   --  78.2*  --   --  75.4*  PLT 279   < > 267  --  227  --  212  --   --   --   --  173  --   --  181   < > = values in this interval not displayed.   Basic Metabolic Panel: Recent Labs  Lab  07/23/20 0500 07/24/20 0436 07/25/20 0544 07/26/20 0610 07/27/20 0602  NA 146* 154* 154* 152* 141  K 3.5 3.2* 3.0* 3.0* 3.1*  CL 111 119* 118* 117* 107  CO2 25 24 22 23 25   GLUCOSE 87 138* 235* 142* 153*  BUN 52* 54* 59* 44* 23  CREATININE 1.50* 1.25* 1.35* 1.45* 1.25*  CALCIUM 8.1* 8.3* 8.3* 8.5* 8.3*  MG  --  2.4  2.3 2.3 2.0   GFR: Estimated Creatinine Clearance: 45.9 mL/min (A) (by C-G formula based on SCr of 1.25 mg/dL (H)). Liver Function Tests: Recent Labs  Lab 07/21/20 2311 07/25/20 0544  AST 33 19  ALT 14 10  ALKPHOS 53 35*  BILITOT 1.1 0.6  PROT 6.3* 5.2*  ALBUMIN 2.7* 2.3*   Recent Labs  Lab 07/21/20 2311  LIPASE 20   No results for input(s): AMMONIA in the last 168 hours. Coagulation Profile: No results for input(s): INR, PROTIME in the last 168 hours. Cardiac Enzymes: No results for input(s): CKTOTAL, CKMB, CKMBINDEX, TROPONINI in the last 168 hours. BNP (last 3 results) No results for input(s): PROBNP in the last 8760 hours. HbA1C: No results for input(s): HGBA1C in the last 72 hours. CBG: Recent Labs  Lab 07/22/20 1412 07/22/20 1727 07/23/20 0836 07/23/20 1533  GLUCAP 144* 122* 81 131*   Lipid Profile: No results for input(s): CHOL, HDL, LDLCALC, TRIG, CHOLHDL, LDLDIRECT in the last 72 hours. Thyroid Function Tests: No results for input(s): TSH, T4TOTAL, FREET4, T3FREE, THYROIDAB in the last 72 hours. Anemia Panel: Recent Labs    07/25/20 0544 07/25/20 1019  VITAMINB12 1,868*  --   TIBC 137*  --   IRON 109  --   RETICCTPCT  --  1.1   Sepsis Labs: Recent Labs  Lab 07/22/20 1102 07/26/20 0610  PROCALCITON 33.44 5.96    Recent Results (from the past 240 hour(s))  Respiratory Panel by RT PCR (Flu A&B, Covid) - Nasopharyngeal Swab     Status: None   Collection Time: 07/21/20  8:48 PM   Specimen: Nasopharyngeal Swab  Result Value Ref Range Status   SARS Coronavirus 2 by RT PCR NEGATIVE NEGATIVE Final    Comment:  (NOTE) SARS-CoV-2 target nucleic acids are NOT DETECTED.  The SARS-CoV-2 RNA is generally detectable in upper respiratoy specimens during the acute phase of infection. The lowest concentration of SARS-CoV-2 viral copies this assay can detect is 131 copies/mL. A negative result does not preclude SARS-Cov-2 infection and should not be used as the sole basis for treatment or other patient management decisions. A negative result may occur with  improper specimen collection/handling, submission of specimen other than nasopharyngeal swab, presence of viral mutation(s) within the areas targeted by this assay, and inadequate number of viral copies (<131 copies/mL). A negative result must be combined with clinical observations, patient history, and epidemiological information. The expected result is Negative.  Fact Sheet for Patients:  https://www.moore.com/  Fact Sheet for Healthcare Providers:  https://www.young.biz/  This test is no t yet approved or cleared by the [place] FDA and  has been authorized for detection and/or diagnosis of SARS-CoV-2 by FDA under an Emergency Use Authorization (EUA). This EUA will remain  in effect (meaning this test can be used) for the duration of the COVID-19 declaration under Section 564(b)(1) of the Act, 21 U.S.C. section 360bbb-3(b)(1), unless the authorization is terminated or revoked sooner.     Influenza A by PCR NEGATIVE NEGATIVE Final   Influenza B by PCR NEGATIVE NEGATIVE Final    Comment: (NOTE) The Xpert Xpress SARS-CoV-2/FLU/RSV assay is intended as an aid in  the diagnosis of influenza from Nasopharyngeal swab specimens and  should not be used as a sole basis for treatment. Nasal washings and  aspirates are unacceptable for Xpert Xpress SARS-CoV-2/FLU/RSV  testing.  Fact Sheet for Patients: https://www.moore.com/  Fact Sheet for Healthcare  Providers: https://www.young.biz/  This test is not yet approved or cleared  by the [place] and  has been authorized for detection and/or diagnosis of SARS-CoV-2 by  FDA under an Emergency Use Authorization (EUA). This EUA will remain  in effect (meaning this test can be used) for the duration of the  Covid-19 declaration under Section 564(b)(1) of the Act, 21  U.S.C. section 360bbb-3(b)(1), unless the authorization is  terminated or revoked. Performed at [facility], [address] [place], [place] [address]   Urine culture     Status: Abnormal   Collection Time: 07/21/20  8:48 PM   Specimen: Urine, Random  Result Value Ref Range Status   Specimen Description   Final    URINE, RANDOM Performed at [facility], [address] [place], [place] [address]    Special Requests   Final    NONE Performed at [facility], [address] [place], [place] [address]    Culture >=100,000 COLONIES/mL ESCHERICHIA COLI (A)  Final   Report Status 07/23/2020 FINAL  Final   Organism ID, Bacteria ESCHERICHIA COLI (A)  Final      Susceptibility   Escherichia coli - MIC*    AMPICILLIN 8 SENSITIVE Sensitive     CEFAZOLIN <=4 SENSITIVE Sensitive     CEFEPIME <=0.12 SENSITIVE Sensitive     CEFTRIAXONE <=0.25 SENSITIVE Sensitive     CIPROFLOXACIN <=0.25 SENSITIVE Sensitive     GENTAMICIN <=1 SENSITIVE Sensitive     IMIPENEM <=0.25 SENSITIVE Sensitive     NITROFURANTOIN <=16 SENSITIVE Sensitive     TRIMETH/SULFA <=20 SENSITIVE Sensitive     AMPICILLIN/SULBACTAM 4 SENSITIVE Sensitive     PIP/TAZO <=4 SENSITIVE Sensitive     * >=100,000 COLONIES/mL ESCHERICHIA COLI  CULTURE, BLOOD (ROUTINE X 2) w Reflex to ID Panel     Status: None   Collection Time: 07/22/20 11:02 AM   Specimen: BLOOD  Result Value Ref Range Status   Specimen Description BLOOD RIGHT ANTECUBITAL  Final   Special Requests   Final    BOTTLES DRAWN AEROBIC ONLY Blood  Culture adequate volume   Culture   Final    NO GROWTH 5 DAYS Performed at [facility], [address] [place], [place] [address]    Report Status 07/27/2020 FINAL  Final  CULTURE, BLOOD (ROUTINE X 2) w Reflex to ID Panel     Status: None   Collection Time: 07/22/20 11:03 AM   Specimen: BLOOD  Result Value Ref Range Status   Specimen Description BLOOD BLOOD RIGHT HAND  Final   Special Requests   Final    BOTTLES DRAWN AEROBIC ONLY Blood Culture results may not be optimal due to an inadequate volume of blood received in culture bottles   Culture   Final    NO GROWTH 5 DAYS Performed at [facility], [address] [place], [place] [address]    Report Status 07/27/2020 FINAL  Final  MRSA PCR Screening     Status: Abnormal   Collection Time: 07/22/20  9:00 PM   Specimen: Nasopharyngeal  Result Value Ref Range Status   MRSA by PCR POSITIVE (A) NEGATIVE Final    Comment:        The GeneXpert MRSA Assay (FDA approved for NASAL specimens only), is one component of a comprehensive MRSA colonization surveillance program. It is not intended to diagnose MRSA infection nor to guide or monitor treatment for MRSA infections. RESULT CALLED TO, READ BACK BY AND VERIFIED WITH: Zettie Cooley RN [id number] 07/22/20 HNM Performed at [facility], [address] [place], [place] [address]          Radiology Studies: CT ABDOMEN PELVIS W CONTRAST  Result Date: 07/25/2020 CLINICAL DATA:  Drop in hemoglobin, retroperitoneal hematoma suspected. EXAM: CT ABDOMEN AND PELVIS WITH CONTRAST TECHNIQUE: Multidetector CT imaging of the abdomen and pelvis was performed using the standard protocol following bolus administration of intravenous contrast. CONTRAST:  80mL OMNIPAQUE IOHEXOL 300 MG/ML  SOLN COMPARISON:  Recent prior abdominal radiographs.  CT 08/02/2017 FINDINGS: Lower chest: Bilateral lower lobe consolidations with trace pleural effusions, left greater than right.  Coronary artery calcifications. Hepatobiliary: No focal hepatic abnormality. No perihepatic fluid. Gallbladder is not well visualized, suspect completely decompressed. There is no evidence of biliary dilatation. Pancreas: No ductal dilatation or inflammation. Spleen: Normal in size without focal abnormality. No evidence of splenic injury or perisplenic hematoma. Adrenals/Urinary Tract: No adrenal nodule or hemorrhage. No hydronephrosis. Mild right renal atrophy which is increased from 2019 exam. No perinephric edema. Parapelvic cyst in the mid lower left kidney is unchanged from prior exam. There is symmetric excretion on delayed phase imaging. Minimal bladder wall thickening but no evidence of bladder hemorrhage. 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 This was seen on prior exam. There is minimal distal esophageal wall thickening. Ingested material within the stomach. The duodenum stays to the right of midline in keeping with bowel malrotation. There is some fluid-filled loops of small bowel in the lower abdomen and pelvis but no evidence of bowel inflammation or mesenteric edema. Cecum appears to be located in the upper central abdomen. Moderate volume of stool in the colon. There is high density material in the rectosigmoid colon. Rectum is distended. No significant colonic inflammation. Vascular/Lymphatic: Aorto bi-iliac atherosclerosis. No aortic aneurysm. There is no evidence of acute aortic abnormality or vasculitis. No retroperitoneal fluid or hemorrhage. No retroperitoneal stranding. Portal vein and mesenteric vessels are patent. No abdominopelvic adenopathy. Reproductive: Prostate is unremarkable. There are bilateral large hydroceles, partially included. Other: No free fluid or evidence of hemoperitoneum. There is no retroperitoneal fluid or hemorrhage. No free air. Mild generalized body wall edema. Musculoskeletal: There are no acute or  suspicious osseous abnormalities. Degenerative change throughout the spine. IMPRESSION: 1. No evidence of retroperitoneal hematoma or intra-abdominal bleed in the abdomen/pelvis. 2. Bilateral lower lobe consolidations with trace pleural effusions, left greater than right, suspicious for pneumonia. 3. 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 This was seen on prior exam. There are prominent fluid-filled loops of small bowel suggesting ileus. No evidence of bowel inflammation or high-grade obstruction. 4. Progressive right renal atrophy since 2018. 5. Bilateral large hydroceles, partially included. Aortic Atherosclerosis (ICD10-I70.0). Electronically Signed   By: Narda RutherfordMelanie  Sanford M.D.   On: 07/25/2020 18:44        Scheduled Meds: . amoxicillin-clavulanate  1 tablet Oral Q12H  . heparin  5,000 Units Subcutaneous Q8H  . mupirocin ointment  1 application Nasal BID  . pantoprazole (PROTONIX) IV  40 mg Intravenous Q12H   Continuous Infusions: . dextrose 5 %-0.45% NaCl with KCl/Additives Pediatric custom IV fluid    . potassium chloride 10 mEq (07/27/20 0929)     LOS: 5 days    Time spent: 35 minutes    Marrion Coyekui Casten Floren, MD [facility]   To contact the attending provider between 7A-7P or the covering provider during after hours 7P-7A, please log into the web site [email or web address] and access  using universal [place] password for that web site. If you do not have the password, please call the hospital operator.  07/27/2020, 9:33 AM

## 2020-07-27 NOTE — Anesthesia Preprocedure Evaluation (Addendum)
Anesthesia Evaluation  Patient identified by MRN, date of birth, ID band Patient awake and Patient confused  General Assessment Comment:Admitted for black stools.  Reviewed: Allergy & Precautions, NPO status , Patient's Chart, lab work & pertinent test results, Unable to perform ROS - Chart review only  History of Anesthesia Complications Negative for: history of anesthetic complications  Airway Mallampati: III  TM Distance: >3 FB     Dental  (+) Implants   Pulmonary neg pulmonary ROS, neg sleep apnea, neg COPD, Patient abstained from smoking.Not current smoker,    Pulmonary exam normal breath sounds clear to auscultation       Cardiovascular Exercise Tolerance: Poor METShypertension, (-) CAD and (-) Past MI (-) dysrhythmias  Rhythm:Regular Rate:Normal - Systolic murmurs 7062: unremarkable TTE and stress test   Neuro/Psych PSYCHIATRIC DISORDERS Dementia negative neurological ROS     GI/Hepatic neg GERD  ,(+)     (-) substance abuse  ,   Endo/Other  diabetes  Renal/GU CRFRenal disease     Musculoskeletal   Abdominal   Peds  Hematology  (+) anemia ,   Anesthesia Other Findings Past Medical History: No date: Arthritis No date: Hypertension associated with diabetes (HCC) No date: Mixed Alzheimer's and vascular dementia (HCC) No date: Neuropathy No date: Type 2 diabetes mellitus (HCC)]   Reproductive/Obstetrics                           Anesthesia Physical Anesthesia Plan  ASA: III  Anesthesia Plan: General   Post-op Pain Management:    Induction: Intravenous  PONV Risk Score and Plan: 2 and Ondansetron, Propofol infusion and TIVA  Airway Management Planned: Nasal Cannula  Additional Equipment: None  Intra-op Plan:   Post-operative Plan:   Informed Consent: I have reviewed the patients History and Physical, chart, labs and discussed the procedure including the risks,  benefits and alternatives for the proposed anesthesia with the patient or authorized representative who has indicated his/her understanding and acceptance.   Patient has DNR.  Discussed DNR with power of attorney and Suspend DNR.   Consent reviewed with POA, History available from chart only and Dental advisory given  Plan Discussed with: CRNA and Surgeon  Anesthesia Plan Comments: (Discussed risks of anesthesia with patient's daugher [place] (due to patient's dementia), including possibility of difficulty with spontaneous ventilation under anesthesia necessitating airway intervention, PONV, and rare risks such as cardiac or respiratory or neurological events. Daughter understands.  Also discussed DNR and its implications. Daughter agrees patient would want to suspend DNR perioperatively for this procedure.)        Anesthesia Quick Evaluation

## 2020-07-28 ENCOUNTER — Inpatient Hospital Stay: Payer: Medicare HMO | Admitting: Anesthesiology

## 2020-07-28 ENCOUNTER — Encounter
Admission: EM | Disposition: A | Discharge status: Home / Self Care | Payer: Self-pay | Source: Home / Self Care | Attending: Internal Medicine

## 2020-07-28 ENCOUNTER — Encounter: Payer: Self-pay | Admitting: Internal Medicine

## 2020-07-28 DIAGNOSIS — N179 Acute kidney failure, unspecified: Secondary | ICD-10-CM | POA: Diagnosis not present

## 2020-07-28 DIAGNOSIS — K250 Acute gastric ulcer with hemorrhage: Secondary | ICD-10-CM

## 2020-07-28 DIAGNOSIS — J690 Pneumonitis due to inhalation of food and vomit: Secondary | ICD-10-CM | POA: Diagnosis not present

## 2020-07-28 DIAGNOSIS — K253 Acute gastric ulcer without hemorrhage or perforation: Secondary | ICD-10-CM | POA: Diagnosis not present

## 2020-07-28 DIAGNOSIS — D62 Acute posthemorrhagic anemia: Secondary | ICD-10-CM | POA: Diagnosis not present

## 2020-07-28 DIAGNOSIS — K921 Melena: Secondary | ICD-10-CM | POA: Diagnosis not present

## 2020-07-28 DIAGNOSIS — A419 Sepsis, unspecified organism: Secondary | ICD-10-CM | POA: Diagnosis not present

## 2020-07-28 HISTORY — PX: ESOPHAGOGASTRODUODENOSCOPY: SHX5428

## 2020-07-28 LAB — TYPE AND SCREEN
ABO/RH(D): B POS
ABO/RH(D): B POS
Antibody Screen: NEGATIVE
Antibody Screen: NEGATIVE
Unit division: 0
Unit division: 0
Unit division: 0
Unit division: 0
Unit division: 0
Unit division: 0

## 2020-07-28 LAB — BASIC METABOLIC PANEL
Anion gap: 7 (ref 5–15)
BUN: 13 mg/dL (ref 8–23)
CO2: 27 mmol/L (ref 22–32)
Calcium: 7.9 mg/dL — ABNORMAL LOW (ref 8.9–10.3)
Chloride: 103 mmol/L (ref 98–111)
Creatinine, Ser: 1.12 mg/dL (ref 0.61–1.24)
GFR, Estimated: >60 mL/min (ref >60)
Glucose, Bld: 134 mg/dL — ABNORMAL HIGH (ref 70–99)
Potassium: 3.2 mmol/L — ABNORMAL LOW (ref 3.5–5.1)
Sodium: 137 mmol/L (ref 135–145)

## 2020-07-28 LAB — CBC WITH DIFFERENTIAL/PLATELET
Abs Immature Granulocytes: 0.88 10*3/uL — ABNORMAL HIGH (ref 0.00–0.07)
Basophils Absolute: 0.1 10*3/uL (ref 0.0–0.1)
Basophils Relative: 1 %
Eosinophils Absolute: 0.1 10*3/uL (ref 0.0–0.5)
Eosinophils Relative: 1 %
HCT: 26.6 % — ABNORMAL LOW (ref 39.0–52.0)
Hemoglobin: 9.3 g/dL — ABNORMAL LOW (ref 13.0–17.0)
Immature Granulocytes: 6 %
Lymphocytes Relative: 9 %
Lymphs Abs: 1.4 10*3/uL (ref 0.7–4.0)
MCH: 26.7 pg (ref 26.0–34.0)
MCHC: 35 g/dL (ref 30.0–36.0)
MCV: 76.4 fL — ABNORMAL LOW (ref 80.0–100.0)
Monocytes Absolute: 0.9 10*3/uL (ref 0.1–1.0)
Monocytes Relative: 6 %
Neutro Abs: 11.7 10*3/uL — ABNORMAL HIGH (ref 1.7–7.7)
Neutrophils Relative %: 77 %
Platelets: 214 10*3/uL (ref 150–400)
RBC: 3.48 MIL/uL — ABNORMAL LOW (ref 4.22–5.81)
RDW: 18.7 % — ABNORMAL HIGH (ref 11.5–15.5)
Smear Review: NORMAL
WBC: 15.1 10*3/uL — ABNORMAL HIGH (ref 4.0–10.5)
nRBC: 0.3 % — ABNORMAL HIGH (ref 0.0–0.2)

## 2020-07-28 LAB — BPAM RBC
Blood Product Expiration Date: 202112042359
Blood Product Expiration Date: 202112042359
Blood Product Expiration Date: 202112042359
Blood Product Expiration Date: 202112042359
Blood Product Expiration Date: 202112042359
Blood Product Expiration Date: 202112042359
ISSUE DATE / TIME: 202111041257
ISSUE DATE / TIME: 202111041533
ISSUE DATE / TIME: 202111041819
Unit Type and Rh: 5100
Unit Type and Rh: 5100
Unit Type and Rh: 7300
Unit Type and Rh: 7300
Unit Type and Rh: 7300
Unit Type and Rh: 7300

## 2020-07-28 LAB — PREPARE RBC (CROSSMATCH)

## 2020-07-28 LAB — MAGNESIUM: Magnesium: 1.9 mg/dL (ref 1.7–2.4)

## 2020-07-28 SURGERY — EGD (ESOPHAGOGASTRODUODENOSCOPY)
Anesthesia: General

## 2020-07-28 MED ORDER — PROPOFOL 10 MG/ML IV BOLUS
INTRAVENOUS | Status: AC
  Filled 2020-07-28: qty 20

## 2020-07-28 MED ORDER — PROPOFOL 500 MG/50ML IV EMUL
INTRAVENOUS | Status: DC | PRN
  Administered 2020-07-28: 150 ug/kg/min via INTRAVENOUS

## 2020-07-28 MED ORDER — POTASSIUM CHLORIDE 10 MEQ/100ML IV SOLN
10.0000 meq | INTRAVENOUS | Status: AC
  Administered 2020-07-28 (×2): 10 meq via INTRAVENOUS
  Filled 2020-07-28 (×2): qty 100

## 2020-07-28 MED ORDER — LIDOCAINE 2% (20 MG/ML) 5 ML SYRINGE
INTRAMUSCULAR | Status: DC | PRN
  Administered 2020-07-28: 100 mg via INTRAVENOUS

## 2020-07-28 MED ORDER — PROPOFOL 10 MG/ML IV BOLUS
INTRAVENOUS | Status: DC | PRN
  Administered 2020-07-28: 50 mg via INTRAVENOUS

## 2020-07-28 MED ORDER — SODIUM CHLORIDE 0.9 % IV SOLN: INTRAVENOUS | Status: DC

## 2020-07-28 MED ORDER — GLYCOPYRROLATE 0.2 MG/ML IJ SOLN
INTRAMUSCULAR | Status: DC | PRN
  Administered 2020-07-28: .2 mg via INTRAVENOUS

## 2020-07-28 MED ORDER — PHENYLEPHRINE HCL (PRESSORS) 10 MG/ML IV SOLN
INTRAVENOUS | Status: DC | PRN
  Administered 2020-07-28: 100 ug via INTRAVENOUS

## 2020-07-28 NOTE — Progress Notes (Signed)
Wound to (R) thigh completed per orders at this time - pt tolerated procedure without difficulty.

## 2020-07-28 NOTE — Progress Notes (Signed)
Elijah Dominguez, pt's daughter called at this time and provided verbal consent for her father to receive an Upper Endoscopy this morning. Conversation witnessed by Doy Hutching, RN, Press photographer. Pt's consent placed in chart.

## 2020-07-28 NOTE — Anesthesia Postprocedure Evaluation (Signed)
Anesthesia Post Note  Patient: Elijah Dominguez  Procedure(s) Performed: ESOPHAGOGASTRODUODENOSCOPY (EGD) (N/A )  Patient location during evaluation: PACU Anesthesia Type: General Level of consciousness: awake and alert Pain management: pain level controlled Vital Signs Assessment: post-procedure vital signs reviewed and stable Respiratory status: spontaneous breathing, nonlabored ventilation, respiratory function stable and patient connected to nasal cannula oxygen Cardiovascular status: blood pressure returned to baseline and stable Postop Assessment: no apparent nausea or vomiting Anesthetic complications: no   No complications documented.   Last Vitals:  Vitals:   07/28/20 1058 07/28/20 1128  BP: 121/81 130/80  Pulse: 94 73  Resp: 15 16  Temp:  36.6 C  SpO2:  (!) 85%    Last Pain:  Vitals:   07/28/20 1128  TempSrc: Oral  PainSc:                  Corinda Gubler

## 2020-07-28 NOTE — Op Note (Signed)
[facility] Gastroenterology Patient Name: Elijah Dominguez Procedure Date: 07/28/2020 10:11 AM MRN: [id number] Account #: [id number] Date of Birth: 12-20-1935 Admit Type: Inpatient Age: 84 Room: [facility] ENDO ROOM 4 Gender: Male Note Status: Finalized Procedure:             Upper GI endoscopy Indications:           Acute post hemorrhagic anemia, Melena Providers:             Briza Bark B. Maximino Greenland MD, MD Referring MD:          Barbette Reichmann, MD (Referring MD) Medicines:             Monitored Anesthesia Care Complications:         No immediate complications. Procedure:             Pre-Anesthesia Assessment:                        - The risks and benefits of the procedure and the                         sedation options and risks were discussed with the                         patient. All questions were answered and informed                         consent was obtained.                        - Patient identification and proposed procedure were                         verified prior to the procedure.                        - ASA Grade Assessment: III - A patient with severe                         systemic disease.                        After obtaining informed consent, the endoscope was                         passed under direct vision. Throughout the procedure,                         the patient's blood pressure, pulse, and oxygen                         saturations were monitored continuously. The Endoscope                         was introduced through the mouth, and advanced to the                         second part of duodenum. The upper GI endoscopy was  accomplished with ease. The patient tolerated the                         procedure well. Findings:      The examined esophagus was normal.      Two non-bleeding gastric ulcers with a clean ulcer base (Forrest Class       III) were found in the gastric body. The largest lesion was 7 mm  in       largest dimension.      The exam of the stomach was otherwise normal.      The examined duodenum was normal. Impression:            - Normal esophagus.                        - Non-bleeding gastric ulcers with a clean ulcer base                         (Forrest Class III).                        - Normal examined duodenum.                        - No specimens collected. Recommendation:        - Perform an H. pylori serology today.                        - Use Protonix (pantoprazole) 40 mg PO BID for 8 weeks.                        - Return patient to hospital ward for ongoing care.                        - Continue present medications.                        - The findings and recommendations were discussed with                         the patient.                        - Gastric ulcers are the likely source of his melena                         and anemia. However, given positive FOBT and history                         of colon cancer in 2007, with no known colonoscopies                         since 2009, if pt continues to have melena he may need                         a colonoscopy on this admission. However, given his                         ongoing treatment with antibiotics, elevated white  count, advanced age and dementia, colonoscopy would be                         best done when pt is in the best medically optimized                         state to avoid any complications during the procedure.                         Dr. Tobi Bastos will be following the patient tomorrow and                         can determine timeline of colonoscopy as necessary.                        - Continue Serial CBCs and transfuse PRN                        - Avoid NSAIDs except Aspirin if medically indicated                         by PCP Procedure Code(s):     --- Professional ---                        [phone], Esophagogastroduodenoscopy, flexible,                          transoral; diagnostic, including collection of                         specimen(s) by brushing or washing, when performed                         (separate procedure) Diagnosis Code(s):     --- Professional ---                        K25.9, Gastric ulcer, unspecified as acute or chronic,                         without hemorrhage or perforation                        D62, Acute posthemorrhagic anemia                        K92.1, Melena (includes Hematochezia) CPT copyright 2019 American Medical Association. All rights reserved. The codes documented in this report are preliminary and upon coder review may  be revised to meet current compliance requirements.  Melodie Bouillon, MD Michel Bickers B. Maximino Greenland MD, MD 07/28/2020 10:42:02 AM This report has been signed electronically. Number of Addenda: 0 Note Initiated On: 07/28/2020 10:11 AM Estimated Blood Loss:  Estimated blood loss: none.      [facility]

## 2020-07-28 NOTE — Transfer of Care (Signed)
Immediate Anesthesia Transfer of Care Note  Patient: Elijah Dominguez  Procedure(s) Performed: ESOPHAGOGASTRODUODENOSCOPY (EGD) (N/A )  Patient Location: Endoscopy Unit  Anesthesia Type:General  Level of Consciousness: sedated  Airway & Oxygen Therapy: Patient Spontanous Breathing  Post-op Assessment: Post -op Vital signs reviewed and stable  Post vital signs: stable  Last Vitals:  Vitals Value Taken Time  BP 105/58 07/28/20 1028  Temp    Pulse 85 07/28/20 1029  Resp 25 07/28/20 1029  SpO2 99 % 07/28/20 1029  Vitals shown include unvalidated device data.  Last Pain:  Vitals:   07/28/20 1028  TempSrc: (P) Temporal  PainSc:          Complications: No complications documented.

## 2020-07-28 NOTE — Progress Notes (Signed)
Pt off unit to receive upper GI Endoscopy at this time.

## 2020-07-28 NOTE — Progress Notes (Addendum)
Elijah Bouillon, MD [address], [place], [place], [address] [address], [address], [place], [place], [address] Phone: [phone]  Fax: [phone]   Subjective: Patient laying in bed with no acute distress.  Black bowel movement reported this morning at 658 under flowsheets.  Patient hemodynamically stable   Objective: Exam: Vital signs in last 24 hours: Vitals:   07/27/20 2025 07/28/20 0100 07/28/20 0527 07/28/20 0720  BP: (!) 143/67 115/66 130/68 124/71  Pulse: 67 88 67 65  Resp: 16 16 16 17   Temp: 98.4 F (36.9 C) 97.8 F (36.6 C) 98.2 F (36.8 C) 98.3 F (36.8 C)  TempSrc: Oral Oral  Oral  SpO2: 100% 99% 98% 97%  Weight:      Height:       Weight change:   Intake/Output Summary (Last 24 hours) at 07/28/2020 13/03/2020 Last data filed at 07/27/2020 1500 Gross per 24 hour  Intake 48.43 ml  Output --  Net 48.43 ml    General: No acute distress, Abd: Soft, NT/ND, No HSM Skin: Warm, no rashes Neck: Supple, Trachea midline   Lab Results: Lab Results  Component Value Date   WBC 15.1 (H) 07/28/2020   HGB 9.3 (L) 07/28/2020   HCT 26.6 (L) 07/28/2020   MCV 76.4 (L) 07/28/2020   PLT 214 07/28/2020   Micro Results: Recent Results (from the past 240 hour(s))  Respiratory Panel by RT PCR (Flu A&B, Covid) - Nasopharyngeal Swab     Status: None   Collection Time: 07/21/20  8:48 PM   Specimen: Nasopharyngeal Swab  Result Value Ref Range Status   SARS Coronavirus 2 by RT PCR NEGATIVE NEGATIVE Final    Comment: (NOTE) SARS-CoV-2 target nucleic acids are NOT DETECTED.  The SARS-CoV-2 RNA is generally detectable in upper respiratoy specimens during the acute phase of infection. The lowest concentration of SARS-CoV-2 viral copies this assay can detect is 131 copies/mL. A negative result does not preclude SARS-Cov-2 infection and should not be used as the sole basis for treatment or other patient management decisions. A negative result may occur with   improper specimen collection/handling, submission of specimen other than nasopharyngeal swab, presence of viral mutation(s) within the areas targeted by this assay, and inadequate number of viral copies (<131 copies/mL). A negative result must be combined with clinical observations, patient history, and epidemiological information. The expected result is Negative.  Fact Sheet for Patients:  07/23/20  Fact Sheet for Healthcare Providers:  https://www.moore.com/  This test is no t yet approved or cleared by the https://www.young.biz/ FDA and  has been authorized for detection and/or diagnosis of SARS-CoV-2 by FDA under an Emergency Use Authorization (EUA). This EUA will remain  in effect (meaning this test can be used) for the duration of the COVID-19 declaration under Section 564(b)(1) of the Act, 21 U.S.C. section 360bbb-3(b)(1), unless the authorization is terminated or revoked sooner.     Influenza A by PCR NEGATIVE NEGATIVE Final   Influenza B by PCR NEGATIVE NEGATIVE Final    Comment: (NOTE) The Xpert Xpress SARS-CoV-2/FLU/RSV assay is intended as an aid in  the diagnosis of influenza from Nasopharyngeal swab specimens and  should not be used as a sole basis for treatment. Nasal washings and  aspirates are unacceptable for Xpert Xpress SARS-CoV-2/FLU/RSV  testing.  Fact Sheet for Patients: [place]  Fact Sheet for Healthcare Providers: https://www.moore.com/  This test is not yet approved or cleared by the https://www.young.biz/ and  has been authorized for  detection and/or diagnosis of SARS-CoV-2 by  FDA under an Emergency Use Authorization (EUA). This EUA will remain  in effect (meaning this test can be used) for the duration of the  Covid-19 declaration under Section 564(b)(1) of the Act, 21  U.S.C. section 360bbb-3(b)(1), unless the authorization is  terminated or  revoked. Performed at [facility], [address], [place], [place] [address]   Urine culture     Status: Abnormal   Collection Time: 07/21/20  8:48 PM   Specimen: Urine, Random  Result Value Ref Range Status   Specimen Description   Final    URINE, RANDOM Performed at [facility] [address] [place], [place] [address]    Special Requests   Final    NONE Performed at [facility], [address] [place], [place] [address]    Culture >=100,000 COLONIES/mL ESCHERICHIA COLI (A)  Final   Report Status 07/23/2020 FINAL  Final   Organism ID, Bacteria ESCHERICHIA COLI (A)  Final      Susceptibility   Escherichia coli - MIC*    AMPICILLIN 8 SENSITIVE Sensitive     CEFAZOLIN <=4 SENSITIVE Sensitive     CEFEPIME <=0.12 SENSITIVE Sensitive     CEFTRIAXONE <=0.25 SENSITIVE Sensitive     CIPROFLOXACIN <=0.25 SENSITIVE Sensitive     GENTAMICIN <=1 SENSITIVE Sensitive     IMIPENEM <=0.25 SENSITIVE Sensitive     NITROFURANTOIN <=16 SENSITIVE Sensitive     TRIMETH/SULFA <=20 SENSITIVE Sensitive     AMPICILLIN/SULBACTAM 4 SENSITIVE Sensitive     PIP/TAZO <=4 SENSITIVE Sensitive     * >=100,000 COLONIES/mL ESCHERICHIA COLI  CULTURE, BLOOD (ROUTINE X 2) w Reflex to ID Panel     Status: None   Collection Time: 07/22/20 11:02 AM   Specimen: BLOOD  Result Value Ref Range Status   Specimen Description BLOOD RIGHT ANTECUBITAL  Final   Special Requests   Final    BOTTLES DRAWN AEROBIC ONLY Blood Culture adequate volume   Culture   Final    NO GROWTH 5 DAYS Performed at [facility], [address] [place], [place] [address]    Report Status 07/27/2020 FINAL  Final  CULTURE, BLOOD (ROUTINE X 2) w Reflex to ID Panel     Status: None   Collection Time: 07/22/20 11:03 AM   Specimen: BLOOD  Result Value Ref Range Status   Specimen Description BLOOD BLOOD RIGHT HAND  Final   Special Requests   Final    BOTTLES DRAWN AEROBIC ONLY Blood Culture  results may not be optimal due to an inadequate volume of blood received in culture bottles   Culture   Final    NO GROWTH 5 DAYS Performed at [facility], [address] [place], [place] [address]    Report Status 07/27/2020 FINAL  Final  MRSA PCR Screening     Status: Abnormal   Collection Time: 07/22/20  9:00 PM   Specimen: Nasopharyngeal  Result Value Ref Range Status   MRSA by PCR POSITIVE (A) NEGATIVE Final    Comment:        The GeneXpert MRSA Assay (FDA approved for NASAL specimens only), is one component of a comprehensive MRSA colonization surveillance program. It is not intended to diagnose MRSA infection nor to guide or monitor treatment for MRSA infections. RESULT CALLED TO, READ BACK BY AND VERIFIED WITH: Zettie Cooley RN [id number] 07/22/20 HNM Performed at [facility], [address] [place], [place] [address]  Studies/Results: No results found. Medications:  Scheduled Meds:  amoxicillin-clavulanate  1 tablet Oral Q12H   heparin  5,000 Units Subcutaneous Q8H   pantoprazole (PROTONIX) IV  40 mg Intravenous Q12H   Continuous Infusions:  sodium chloride     dextrose 5 % and 0.45 % NaCl with KCl 20 mEq/L 50 mL/hr at 07/27/20 1401   potassium chloride     PRN Meds:.acetaminophen **OR** acetaminophen, ondansetron **OR** ondansetron (ZOFRAN) IV   Assessment: Principal Problem:   Severe sepsis with acute organ dysfunction (HCC) Active Problems:   AKI (acute kidney injury) (HCC)   E. coli UTI   Type 2 diabetes mellitus (HCC)   Hypertension associated with diabetes (HCC)   Mixed Alzheimer's and vascular dementia (HCC)   Aspiration pneumonia (HCC)   SBO (small bowel obstruction) (HCC)   Pressure injury of skin   Severe protein-calorie malnutrition (HCC)   Anemia   Altered mental status   Acute blood loss anemia    Plan: I talked with patient's daughter, [place] Ana in detail.  Due to melena noted, primary team ordered fecal  occult test which was positive yesterday.  Given ongoing melena, presentation with anemia and now positive fecal occult test, family would like to proceed with endoscopic evaluation  Given that patient may have difficult time completing a prep, given his dementia, we will for start with upper endoscopy and if negative, subsequent colonoscopy especially given his previous history of colon cancer  Family is agreeable with above plan  Alternative options of conservative management were discussed in detail, including but not limited to medication management, foregoing endoscopic procedures at this time and others.    Dr. Tobi Bastos will follow from tomorrow  I have discussed alternative options, risks & benefits,  which include, but are not limited to, bleeding, infection, perforation,respiratory complication & drug reaction.  The patient or family agrees with this plan & written consent will be obtained.    Patient has received antibiotics for a few days since hospitalization, and is on room air oxygen, therefore medically optimized for procedure at this time    LOS: 6 days   Elijah Bouillon, MD 07/28/2020, 8:11 AM

## 2020-07-28 NOTE — Progress Notes (Signed)
Daughter at pt's bedside, MD update pt regarding results of Endoscopy - pt eating lunch at this time, will continue to monitor.

## 2020-07-28 NOTE — Progress Notes (Signed)
Pt back on unit from receiving Endoscopy - NAD noted - pt resumed on regular diet with thin liquids - morning medications given at this time.

## 2020-07-28 NOTE — Progress Notes (Signed)
PROGRESS NOTE    Elijah Dominguez  KDX:[id number] DOB: 06-08-1936 DOA: 07/21/2020 PCP: Barbette Reichmann, MD    Brief Narrative:   Elijah Dominguez a 84 y.o.malewith medical history significant formixed Alzheimer's and vascular dementia, HTN, T2DM, history of CVA, BPH s/p laser TURP who presents to the ED for evaluation ofnausea and vomiting.  Per daughter, patient has dementia with confusion at baseline and has been nonambulatory for the last month. She says he has been eating well but has been losing weight. Before admission,he began to have new onset of nausea and vomiting. In the emergency room, patient was a found to have secondary hypoxemia, was placed on 4 L oxygen. Chest x-ray showed bilateral atelectasis/infiltrates, right greater than left. Her KUB showed small bowel obstruction.Patient is placed on IV antibiotics with Unasyn and vancomycin for aspiration pneumonia.  11/4.Vancomycin was discontinued on 11/3. Patient hemoglobin dropped down to4.8today, transfusing 3units PRBC. Check stool for occult blood, also work-up for hemolytic anemia. Blood culture came back negative, urine culture positive for E. Coli. 11/5.CT scan did not show any intra-abdominal bleeding,  11/7. Patient had a positive occult blood in stool, patient also had a multiple tarry stools. EGD showed gastric ulcer without further bleeding. Continue PPI.  Assessment & Plan:   Principal Problem:   Severe sepsis with acute organ dysfunction (HCC) Active Problems:   AKI (acute kidney injury) (HCC)   E. coli UTI   Type 2 diabetes mellitus (HCC)   Hypertension associated with diabetes (HCC)   Mixed Alzheimer's and vascular dementia (HCC)   Aspiration pneumonia (HCC)   SBO (small bowel obstruction) (HCC)   Pressure injury of skin   Severe protein-calorie malnutrition (HCC)   Anemia   Altered mental status   Acute blood loss anemia  #1. Severe sepsis secondary to E. coli urinary tract  Infection and aspiration pneumonia. Condition had improved  2. Acute blood loss anemia on chronic anemia. EGD showed gastric ulcer disease. No further bleeding. Hemoglobin has been stable. Change Protonix to oral pain  3. Right lower lobe aspiration pneumonia. Continue Augmentin.  4. E. coli urinary tract infection. Continue Augmentin.  5. Acute kidney injury on chronic kidney disease stage IIIb. Renal function stable.  6. Hypernatremia with hypokalemia. Sodium level has normalized. Continue supplement potassium.  7. Mixed Alzheimer's and vascular dementia.    DVT prophylaxis: Heparin Code Status: DNR Family Communication: Daughter updated. Disposition Plan:  .   Status is: Inpatient  Remains inpatient appropriate because:Inpatient level of care appropriate due to severity of illness   Dispo: The patient is from: Home              Anticipated d/c is to: Home              Anticipated d/c date is: 1 day              Patient currently is not medically stable to d/c.        I/O last 3 completed shifts: In: 288.4 [P.O.:240; I.V.:48.4] Out: -  Total I/O In: 180 [P.O.:180] Out: -      Consultants:   GI  Procedures: EGD  Antimicrobials:  Augmentin  Subjective: Patient still confused as his baseline. No additional black stool today. No nausea vomiting abdominal pain. No short of breath or cough, off oxygen. No dysuria hematuria.  Objective: Vitals:   07/28/20 1038 07/28/20 1048 07/28/20 1058 07/28/20 1128  BP: 107/70 112/75 121/81 130/80  Pulse: 90 86 94 73  Resp: Marland Kitchen)  25 17 15 16   Temp:    97.8 F (36.6 C)  TempSrc:    Oral  SpO2: 98% 98%  (!) 85%  Weight:      Height:        Intake/Output Summary (Last 24 hours) at 07/28/2020 1317 Last data filed at 07/28/2020 1056 Gross per 24 hour  Intake 228.43 ml  Output --  Net 228.43 ml   Filed Weights   07/22/20 0847  Weight: 71.2 kg    Examination:  General exam: Appears calm and comfortable,  ill-appearing and malnourished. Respiratory system: Clear to auscultation. Respiratory effort normal. Cardiovascular system: S1 & S2 heard, RRR. No JVD, murmurs, rubs, gallops or clicks. No pedal edema. Gastrointestinal system: Abdomen is nondistended, soft and nontender. No organomegaly or masses felt. Normal bowel sounds heard. Central nervous system: Alert and oriented x1. No focal neurological deficits. Extremities: Symmetric 5 x 5 power. Skin: No rashes, lesions or ulcers      Data Reviewed: I have personally reviewed following labs and imaging studies  CBC: Recent Labs  Lab 07/24/20 0436 07/24/20 0436 07/25/20 0544 07/25/20 1221 07/26/20 0028 07/26/20 0028 07/26/20 0610 07/26/20 1213 07/27/20 0054 07/27/20 0602 07/28/20 0453  WBC 14.6*  --  16.0*  --   --   --  15.9*  --   --  14.7* 15.1*  NEUTROABS 12.8*  --  13.4*  --   --   --  12.7*  --   --  11.3* 11.7*  HGB 8.0*   < > 5.6*   < > 9.6*   < > 9.6* 9.8* 9.2* 9.0* 9.3*  HCT 23.1*   < > 16.6*   < > 28.5*  --  28.0* 29.1*  --  25.4* 26.6*  MCV 70.0*  --  70.6*  --   --   --  78.2*  --   --  75.4* 76.4*  PLT 227  --  212  --   --   --  173  --   --  181 214   < > = values in this interval not displayed.   Basic Metabolic Panel: Recent Labs  Lab 07/24/20 0436 07/25/20 0544 07/26/20 0610 07/27/20 0602 07/28/20 0453  NA 154* 154* 152* 141 137  K 3.2* 3.0* 3.0* 3.1* 3.2*  CL 119* 118* 117* 107 103  CO2 24 22 23 25 27   GLUCOSE 138* 235* 142* 153* 134*  BUN 54* 59* 44* 23 13  CREATININE 1.25* 1.35* 1.45* 1.25* 1.12  CALCIUM 8.3* 8.3* 8.5* 8.3* 7.9*  MG 2.4 2.3 2.3 2.0 1.9   GFR: Estimated Creatinine Clearance: 51.2 mL/min (by C-G formula based on SCr of 1.12 mg/dL). Liver Function Tests: Recent Labs  Lab 07/21/20 2311 07/25/20 0544  AST 33 19  ALT 14 10  ALKPHOS 53 35*  BILITOT 1.1 0.6  PROT 6.3* 5.2*  ALBUMIN 2.7* 2.3*   Recent Labs  Lab 07/21/20 2311  LIPASE 20   No results for input(s): AMMONIA  in the last 168 hours. Coagulation Profile: No results for input(s): INR, PROTIME in the last 168 hours. Cardiac Enzymes: No results for input(s): CKTOTAL, CKMB, CKMBINDEX, TROPONINI in the last 168 hours. BNP (last 3 results) No results for input(s): PROBNP in the last 8760 hours. HbA1C: No results for input(s): HGBA1C in the last 72 hours. CBG: Recent Labs  Lab 07/22/20 1412 07/22/20 1727 07/23/20 0836 07/23/20 1533  GLUCAP 144* 122* 81 131*   Lipid Profile: No results for input(s):  CHOL, HDL, LDLCALC, TRIG, CHOLHDL, LDLDIRECT in the last 72 hours. Thyroid Function Tests: No results for input(s): TSH, T4TOTAL, FREET4, T3FREE, THYROIDAB in the last 72 hours. Anemia Panel: No results for input(s): VITAMINB12, FOLATE, FERRITIN, TIBC, IRON, RETICCTPCT in the last 72 hours. Sepsis Labs: Recent Labs  Lab 07/22/20 1102 07/26/20 0610  PROCALCITON 33.44 5.96    Recent Results (from the past 240 hour(s))  Respiratory Panel by RT PCR (Flu A&B, Covid) - Nasopharyngeal Swab     Status: None   Collection Time: 07/21/20  8:48 PM   Specimen: Nasopharyngeal Swab  Result Value Ref Range Status   SARS Coronavirus 2 by RT PCR NEGATIVE NEGATIVE Final    Comment: (NOTE) SARS-CoV-2 target nucleic acids are NOT DETECTED.  The SARS-CoV-2 RNA is generally detectable in upper respiratoy specimens during the acute phase of infection. The lowest concentration of SARS-CoV-2 viral copies this assay can detect is 131 copies/mL. A negative result does not preclude SARS-Cov-2 infection and should not be used as the sole basis for treatment or other patient management decisions. A negative result may occur with  improper specimen collection/handling, submission of specimen other than nasopharyngeal swab, presence of viral mutation(s) within the areas targeted by this assay, and inadequate number of viral copies (<131 copies/mL). A negative result must be combined with clinical observations, patient  history, and epidemiological information. The expected result is Negative.  Fact Sheet for Patients:  https://www.moore.com/  Fact Sheet for Healthcare Providers:  https://www.young.biz/  This test is no t yet approved or cleared by the [place] FDA and  has been authorized for detection and/or diagnosis of SARS-CoV-2 by FDA under an Emergency Use Authorization (EUA). This EUA will remain  in effect (meaning this test can be used) for the duration of the COVID-19 declaration under Section 564(b)(1) of the Act, 21 U.S.C. section 360bbb-3(b)(1), unless the authorization is terminated or revoked sooner.     Influenza A by PCR NEGATIVE NEGATIVE Final   Influenza B by PCR NEGATIVE NEGATIVE Final    Comment: (NOTE) The Xpert Xpress SARS-CoV-2/FLU/RSV assay is intended as an aid in  the diagnosis of influenza from Nasopharyngeal swab specimens and  should not be used as a sole basis for treatment. Nasal washings and  aspirates are unacceptable for Xpert Xpress SARS-CoV-2/FLU/RSV  testing.  Fact Sheet for Patients: https://www.moore.com/  Fact Sheet for Healthcare Providers: https://www.young.biz/  This test is not yet approved or cleared by the [place] FDA and  has been authorized for detection and/or diagnosis of SARS-CoV-2 by  FDA under an Emergency Use Authorization (EUA). This EUA will remain  in effect (meaning this test can be used) for the duration of the  Covid-19 declaration under Section 564(b)(1) of the Act, 21  U.S.C. section 360bbb-3(b)(1), unless the authorization is  terminated or revoked. Performed at [facility], [address] [place], [place] [address]   Urine culture     Status: Abnormal   Collection Time: 07/21/20  8:48 PM   Specimen: Urine, Random  Result Value Ref Range Status   Specimen Description   Final    URINE, RANDOM Performed at [facility], [address], [place], [place] [address]    Special Requests   Final    NONE Performed at [facility], [address] [place], [place] [address]    Culture >=100,000 COLONIES/mL ESCHERICHIA COLI (A)  Final   Report Status 07/23/2020 FINAL  Final   Organism ID, Bacteria ESCHERICHIA COLI (  A)  Final      Susceptibility   Escherichia coli - MIC*    AMPICILLIN 8 SENSITIVE Sensitive     CEFAZOLIN <=4 SENSITIVE Sensitive     CEFEPIME <=0.12 SENSITIVE Sensitive     CEFTRIAXONE <=0.25 SENSITIVE Sensitive     CIPROFLOXACIN <=0.25 SENSITIVE Sensitive     GENTAMICIN <=1 SENSITIVE Sensitive     IMIPENEM <=0.25 SENSITIVE Sensitive     NITROFURANTOIN <=16 SENSITIVE Sensitive     TRIMETH/SULFA <=20 SENSITIVE Sensitive     AMPICILLIN/SULBACTAM 4 SENSITIVE Sensitive     PIP/TAZO <=4 SENSITIVE Sensitive     * >=100,000 COLONIES/mL ESCHERICHIA COLI  CULTURE, BLOOD (ROUTINE X 2) w Reflex to ID Panel     Status: None   Collection Time: 07/22/20 11:02 AM   Specimen: BLOOD  Result Value Ref Range Status   Specimen Description BLOOD RIGHT ANTECUBITAL  Final   Special Requests   Final    BOTTLES DRAWN AEROBIC ONLY Blood Culture adequate volume   Culture   Final    NO GROWTH 5 DAYS Performed at [facility], [address] [place], [place] [address]    Report Status 07/27/2020 FINAL  Final  CULTURE, BLOOD (ROUTINE X 2) w Reflex to ID Panel     Status: None   Collection Time: 07/22/20 11:03 AM   Specimen: BLOOD  Result Value Ref Range Status   Specimen Description BLOOD BLOOD RIGHT HAND  Final   Special Requests   Final    BOTTLES DRAWN AEROBIC ONLY Blood Culture results may not be optimal due to an inadequate volume of blood received in culture bottles   Culture   Final    NO GROWTH 5 DAYS Performed at [facility], [address] [place], [place] [address]    Report Status 07/27/2020 FINAL  Final  MRSA PCR Screening     Status: Abnormal   Collection  Time: 07/22/20  9:00 PM   Specimen: Nasopharyngeal  Result Value Ref Range Status   MRSA by PCR POSITIVE (A) NEGATIVE Final    Comment:        The GeneXpert MRSA Assay (FDA approved for NASAL specimens only), is one component of a comprehensive MRSA colonization surveillance program. It is not intended to diagnose MRSA infection nor to guide or monitor treatment for MRSA infections. RESULT CALLED TO, READ BACK BY AND VERIFIED WITH: Zettie Cooley RN [id number] 07/22/20 HNM Performed at [facility], [address] [place], [place] [address]          Radiology Studies: No results found.      Scheduled Meds: . amoxicillin-clavulanate  1 tablet Oral Q12H  . heparin  5,000 Units Subcutaneous Q8H  . pantoprazole (PROTONIX) IV  40 mg Intravenous Q12H   Continuous Infusions: . dextrose 5 % and 0.45 % NaCl with KCl 20 mEq/L 50 mL/hr at 07/27/20 1401     LOS: 6 days    Time spent: 28 minutes    Marrion Coy, MD [facility]   To contact the attending provider between 7A-7P or the covering provider during after hours 7P-7A, please log into the web site [email or web address] and access using universal [place] password for that web site. If you do not have the password, please call the hospital operator.  07/28/2020, 1:17 PM

## 2020-07-29 DIAGNOSIS — N179 Acute kidney failure, unspecified: Secondary | ICD-10-CM | POA: Diagnosis not present

## 2020-07-29 DIAGNOSIS — D62 Acute posthemorrhagic anemia: Secondary | ICD-10-CM | POA: Diagnosis not present

## 2020-07-29 DIAGNOSIS — K250 Acute gastric ulcer with hemorrhage: Secondary | ICD-10-CM | POA: Diagnosis not present

## 2020-07-29 DIAGNOSIS — A419 Sepsis, unspecified organism: Secondary | ICD-10-CM | POA: Diagnosis not present

## 2020-07-29 LAB — CBC WITH DIFFERENTIAL/PLATELET
Abs Immature Granulocytes: 0.74 10*3/uL — ABNORMAL HIGH (ref 0.00–0.07)
Basophils Absolute: 0.1 10*3/uL (ref 0.0–0.1)
Basophils Relative: 1 %
Eosinophils Absolute: 0.1 10*3/uL (ref 0.0–0.5)
Eosinophils Relative: 0 %
HCT: 24.9 % — ABNORMAL LOW (ref 39.0–52.0)
Hemoglobin: 8.7 g/dL — ABNORMAL LOW (ref 13.0–17.0)
Immature Granulocytes: 5 %
Lymphocytes Relative: 7 %
Lymphs Abs: 1.1 10*3/uL (ref 0.7–4.0)
MCH: 26.8 pg (ref 26.0–34.0)
MCHC: 34.9 g/dL (ref 30.0–36.0)
MCV: 76.6 fL — ABNORMAL LOW (ref 80.0–100.0)
Monocytes Absolute: 0.9 10*3/uL (ref 0.1–1.0)
Monocytes Relative: 6 %
Neutro Abs: 11.8 10*3/uL — ABNORMAL HIGH (ref 1.7–7.7)
Neutrophils Relative %: 81 %
Platelets: 261 10*3/uL (ref 150–400)
RBC: 3.25 MIL/uL — ABNORMAL LOW (ref 4.22–5.81)
RDW: 18.9 % — ABNORMAL HIGH (ref 11.5–15.5)
WBC: 14.6 10*3/uL — ABNORMAL HIGH (ref 4.0–10.5)
nRBC: 0 % (ref 0.0–0.2)

## 2020-07-29 LAB — BASIC METABOLIC PANEL
Anion gap: 7 (ref 5–15)
BUN: 11 mg/dL (ref 8–23)
CO2: 26 mmol/L (ref 22–32)
Calcium: 7.7 mg/dL — ABNORMAL LOW (ref 8.9–10.3)
Chloride: 102 mmol/L (ref 98–111)
Creatinine, Ser: 1.17 mg/dL (ref 0.61–1.24)
GFR, Estimated: >60 mL/min (ref >60)
Glucose, Bld: 173 mg/dL — ABNORMAL HIGH (ref 70–99)
Potassium: 3.3 mmol/L — ABNORMAL LOW (ref 3.5–5.1)
Sodium: 135 mmol/L (ref 135–145)

## 2020-07-29 LAB — GLUCOSE, CAPILLARY
Glucose-Capillary: 131 mg/dL — ABNORMAL HIGH (ref 70–99)
Glucose-Capillary: 180 mg/dL — ABNORMAL HIGH (ref 70–99)

## 2020-07-29 LAB — MAGNESIUM: Magnesium: 2.1 mg/dL (ref 1.7–2.4)

## 2020-07-29 MED ORDER — SODIUM CHLORIDE 0.9 % IV SOLN: INTRAVENOUS | Status: DC

## 2020-07-29 MED ORDER — POTASSIUM CHLORIDE 10 MEQ/100ML IV SOLN
10.0000 meq | INTRAVENOUS | Status: DC
Start: 2020-07-29 — End: 2020-07-29

## 2020-07-29 MED ORDER — POTASSIUM CHLORIDE 10 MEQ/100ML IV SOLN
10.0000 meq | INTRAVENOUS | Status: AC
  Administered 2020-07-29 (×4): 10 meq via INTRAVENOUS
  Filled 2020-07-29: qty 100

## 2020-07-29 MED ORDER — AMOXICILLIN-POT CLAVULANATE 500-125 MG PO TABS: 1.0000 | ORAL_TABLET | Freq: Two times a day (BID) | ORAL | 0 refills | Status: AC

## 2020-07-29 MED ORDER — POTASSIUM CHLORIDE ER 10 MEQ PO TBCR: 20.0000 meq | EXTENDED_RELEASE_TABLET | Freq: Two times a day (BID) | ORAL | 0 refills | Status: AC

## 2020-07-29 MED ORDER — PANTOPRAZOLE SODIUM 40 MG PO TBEC: 40.0000 mg | DELAYED_RELEASE_TABLET | Freq: Every day | ORAL | 0 refills | Status: AC

## 2020-07-29 NOTE — Discharge Summary (Signed)
Physician Discharge Summary  Patient ID: Elijah Dominguez MRN: [id number] DOB/AGE: [birth year] 84 y.o.  Admit date: 07/21/2020 Discharge date: 07/29/2020  Admission Diagnoses:  Discharge Diagnoses:  Principal Problem:   Severe sepsis with acute organ dysfunction (HCC) Active Problems:   AKI (acute kidney injury) (HCC)   E. coli UTI   Type 2 diabetes mellitus (HCC)   Hypertension associated with diabetes (HCC)   Mixed Alzheimer's and vascular dementia (HCC)   Aspiration pneumonia (HCC)   SBO (small bowel obstruction) (HCC)   Pressure injury of skin   Severe protein-calorie malnutrition (HCC)   Anemia   Altered mental status   Acute blood loss anemia   Acute gastric ulcer with bleeding Acute renal failure secondary to ATN.  Discharged Condition: fair  Hospital Course:   Elijah Dominguez a 84 y.o.malewith medical history significant formixed Alzheimer's and vascular dementia, HTN, T2DM, history of CVA, BPH s/p laser TURP who presents to the ED for evaluation ofnausea and vomiting.  Per daughter, patient has dementia with confusion at baseline and has been nonambulatory for the last month. She says he has been eating well but has been losing weight. Before admission,he began to have new onset of nausea and vomiting. In the emergency room, patient was a found to have secondary hypoxemia, was placed on 4 L oxygen. Chest x-ray showed bilateral atelectasis/infiltrates, right greater than left. Her KUB showed small bowel obstruction.Patient is placed on IV antibiotics with Unasyn and vancomycin for aspiration pneumonia.  11/4.Vancomycin was discontinued on 11/3. Patient hemoglobin dropped down to4.8today, transfusing 3units PRBC. Check stool for occult blood, also work-up for hemolytic anemia. Blood culture came back negative, urine culture positive for E. Coli. 11/5.CT scan did not show any intra-abdominal bleeding,  11/7. Patient had a positive occult blood in stool,  patient also had a multiple tarry stools. EGD showed gastric ulcer without further bleeding. Continue PPI.  #1. Severe sepsis secondary to E. coli urinary tract Infection and aspiration pneumonia. Condition had improved  2. Acute blood loss anemia on chronic anemia. Upper GI bleed secondary to gastric ulcer. EGD showed gastric ulcer disease. No further bleeding. Hemoglobin has been stable. Change Protonix to oral for 8 weeks. Follow-up with GI in 2 weeks.  3. Right lower lobe aspiration pneumonia. Continue Augmentin for 3 more days.  4. E. coli urinary tract infection. Continue Augmentin.  5. Acute kidney injury on chronic kidney disease stage 2 (final dx). Patient renal function had improved, currently GFR is more than 60.  Patient only has chronic stage II kidney disease.  6. Hypernatremia with hypokalemia. Sodium level has normalized. Continue supplement potassium for 1 week  7. Mixed Alzheimer's and vascular dementia.  8. Pressure ulcer POA. Pressure Injury 07/22/20 Scrotum Posterior Stage 2 -  Partial thickness loss of dermis presenting as a shallow open injury with a red, pink wound bed without slough. (Active)  07/22/20 1212  Location: Scrotum  Location Orientation: Posterior  Staging: Stage 2 -  Partial thickness loss of dermis presenting as a shallow open injury with a red, pink wound bed without slough.  Wound Description (Comments):   Present on Admission: Yes        Consults: GI  Significant Diagnostic Studies: CT ABDOMEN AND PELVIS WITH CONTRAST  TECHNIQUE: Multidetector CT imaging of the abdomen and pelvis was performed using the standard protocol following bolus administration of intravenous contrast.  CONTRAST:  31mL OMNIPAQUE IOHEXOL 300 MG/ML  SOLN  COMPARISON:  Recent prior abdominal radiographs.  CT 08/02/2017  FINDINGS: Lower chest: Bilateral lower lobe consolidations with trace pleural effusions, left greater than right. Coronary  artery calcifications.  Hepatobiliary: No focal hepatic abnormality. No perihepatic fluid. Gallbladder is not well visualized, suspect completely decompressed. There is no evidence of biliary dilatation.  Pancreas: No ductal dilatation or inflammation.  Spleen: Normal in size without focal abnormality. No evidence of splenic injury or perisplenic hematoma.  Adrenals/Urinary Tract: No adrenal nodule or hemorrhage. No hydronephrosis. Mild right renal atrophy which is increased from 2019 exam. No perinephric edema. Parapelvic cyst in the mid lower left kidney is unchanged from prior exam. There is symmetric excretion on delayed phase imaging. Minimal bladder wall thickening but no evidence of bladder hemorrhage.  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 This was seen on prior exam. There is minimal distal esophageal wall thickening. Ingested material within the stomach. The duodenum stays to the right of midline in keeping with bowel malrotation. There is some fluid-filled loops of small bowel in the lower abdomen and pelvis but no evidence of bowel inflammation or mesenteric edema. Cecum appears to be located in the upper central abdomen. Moderate volume of stool in the colon. There is high density material in the rectosigmoid colon. Rectum is distended. No significant colonic inflammation.  Vascular/Lymphatic: Aorto bi-iliac atherosclerosis. No aortic aneurysm. There is no evidence of acute aortic abnormality or vasculitis. No retroperitoneal fluid or hemorrhage. No retroperitoneal stranding. Portal vein and mesenteric vessels are patent. No abdominopelvic adenopathy.  Reproductive: Prostate is unremarkable. There are bilateral large hydroceles, partially included.  Other: No free fluid or evidence of hemoperitoneum. There is no retroperitoneal fluid or hemorrhage. No free air. Mild generalized body wall  edema.  Musculoskeletal: There are no acute or suspicious osseous abnormalities. Degenerative change throughout the spine.  IMPRESSION: 1. No evidence of retroperitoneal hematoma or intra-abdominal bleed in the abdomen/pelvis. 2. Bilateral lower lobe consolidations with trace pleural effusions, left greater than right, suspicious for pneumonia. 3. 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 This was seen on prior exam. There are prominent fluid-filled loops of small bowel suggesting ileus. No evidence of bowel inflammation or high-grade obstruction. 4. Progressive right renal atrophy since 2018. 5. Bilateral large hydroceles, partially included.  Aortic Atherosclerosis (ICD10-I70.0).   Electronically Signed   By: Narda Rutherford M.D.   On: 07/25/2020 18:44  EGD:  Normal esophagus. - Non-bleeding gastric ulcers with a clean ulcer base (Forrest Class III). - Normal examined duodenum. - No specimens collected.   Treatments: IV fluids, antibiotics, IV Protonix, EGD.  Discharge Exam: Blood pressure 132/79, pulse 73, temperature 98.1 F (36.7 C), temperature source Oral, resp. rate 16, height 5\' 11"  (1.803 m), weight 71.2 kg, SpO2 100 %. General appearance: alert, cooperative and Oriented to person only, severely malnourished. Resp: clear to auscultation bilaterally Cardio: regular rate and rhythm, S1, S2 normal, no murmur, click, rub or gallop GI: soft, non-tender; bowel sounds normal; no masses,  no organomegaly Extremities: extremities normal, atraumatic, no cyanosis or edema  Disposition: Discharge disposition: 01-Home or Self Care       Discharge Instructions    Diet - low sodium heart healthy   Complete by: As directed    Discharge wound care:   Complete by: As directed    Visiting RN dress changes   Increase activity slowly   Complete by: As directed      Allergies as of 07/29/2020   No Known  Allergies      Medication List    TAKE these medications   amoxicillin-clavulanate 500-125 MG tablet Commonly known as: AUGMENTIN Take 1 tablet (500 mg total) by mouth every 12 (twelve) hours for 3 days.   gabapentin 100 MG capsule Commonly known as: NEURONTIN Take 100 mg by mouth 2 (two) times daily.   lisinopril 5 MG tablet Commonly known as: ZESTRIL Take 5 mg by mouth daily.   memantine 5 MG tablet Commonly known as: NAMENDA Take 5 mg by mouth 2 (two) times daily.   pantoprazole 40 MG tablet Commonly known as: Protonix Take 1 tablet (40 mg total) by mouth daily.   potassium chloride 10 MEQ tablet Commonly known as: KLOR-CON Take 2 tablets (20 mEq total) by mouth 2 (two) times daily for 7 days.   vitamin B-12 1000 MCG tablet Commonly known as: CYANOCOBALAMIN Take 1,000 mcg by mouth daily.            Durable Medical Equipment  (From admission, onward)         Start     Ordered   07/24/20 1235  For home use only DME Hospital bed  Once       Question Answer Comment  Length of Need Lifetime   Patient has (list medical condition): Alzehimer's and Vasular dementia   The above medical condition requires: Patient requires the ability to reposition frequently   Head must be elevated greater than: 30 degrees   Bed type Semi-electric   Hoyer Lift Yes   Support Surface: Gel Overlay      07/24/20 1235   07/24/20 1233  For home use only DME lightweight manual wheelchair with seat cushion  Once       Comments: Patient suffers from mixed alzheimer's and vascular dementia which impairs their ability to perform daily activities like bathing, dressing, ambulating in the home.  A walker will not resolve  issue with performing activities of daily living. A wheelchair will allow patient to safely perform daily activities. Patient is not able to propel themselves in the home using a standard weight wheelchair due to weakness. Patient can self propel in the lightweight wheelchair. Length of need  lifetime. Accessories: elevating leg rests (ELRs), wheel locks, extensions and anti-tippers, seat cushion and back cushion.   07/24/20 1235           Discharge Care Instructions  (From admission, onward)         Start     Ordered   07/29/20 0000  Discharge wound care:       Comments: Visiting RN dress changes   07/29/20 0938          Follow-up Information    Barbette Reichmann, MD Follow up in 1 week(s).   Specialty: Internal Medicine Contact information: [address] [place] [address] [phone]        Pasty Spillers, MD Follow up in 2 week(s).   Specialty: Gastroenterology Contact information: [address] [place] [address] [phone]             35 minutes  Signed: Marrion Coy 07/29/2020, 9:38 AM

## 2020-07-29 NOTE — TOC Transition Note (Signed)
Transition of Care [facility]) - CM/SW Discharge Note   Patient Details  Name: Elijah Dominguez MRN: [id number] Date of Birth: December 04, 1935  Transition of Care [facility] CM/SW Contact:  Allayne Butcher, RN Phone Number: 07/29/2020, 11:30 AM   Clinical Narrative:    Patient is medically clear for discharge home with his daughter.  Hospital  bed and wheelchair delivered to the patient's home this past Friday.  Patient's daughter Elijah Dominguez will come and pick the patient up this afternoon.  Daughter denies need for home health care.   Final next level of care: Home/Self Care Barriers to Discharge: No Barriers Identified   Patient Goals and CMS Choice Patient states their goals for this hospitalization and ongoing recovery are:: Daughter Elijah Dominguez at the bedside and wants to take the patient home at discharge. CMS Medicare.gov Compare Post Acute Care list provided to:: Patient Represenative (must comment) Choice offered to / list presented to : Adult Children  Discharge Placement                       Discharge Plan and Services   Discharge Planning Services: CM Consult Post Acute Care Choice: Durable Medical Equipment          DME Arranged: Hospital bed, Lightweight manual wheelchair with seat cushion DME Agency: [employer] Date DME Agency Contacted: 07/24/20 Time DME Agency Contacted: [phone] Representative spoke with at DME Agency: Elease Hashimoto [facility] Arranged: NA          Social Determinants of Health (SDOH) Interventions     Readmission Risk Interventions No flowsheet data found.

## 2020-07-29 NOTE — Care Management Important Message (Signed)
Important Message  Patient Details  Name: Elijah Dominguez MRN: [id number] Date of Birth: [birth year]   Medicare Important Message Given:  Yes     [name] 07/29/2020, 10:17 AM

## 2020-07-29 NOTE — Progress Notes (Signed)
IV removed before discharge. Went over discharge instructions and medications with patients daughter. All questions answered. Patient going home POV with daughter.

## 2020-07-31 ENCOUNTER — Encounter: Payer: Self-pay | Admitting: Gastroenterology

## 2020-08-13 ENCOUNTER — Ambulatory Visit: Payer: Medicare HMO | Admitting: Gastroenterology

## 2020-08-30 ENCOUNTER — Other Ambulatory Visit: Payer: Self-pay

## 2020-09-03 ENCOUNTER — Encounter: Payer: Self-pay | Admitting: *Deleted

## 2020-09-03 ENCOUNTER — Ambulatory Visit: Payer: Medicare HMO | Admitting: Gastroenterology

## 2021-05-22 DEATH — deceased

## 2022-09-02 IMAGING — RF DG ESOPHAGUS
3 series · 7 of 7 positions shown · non-contrast
Comparison: No prior.

CLINICAL DATA: Difficulty swallowing.

EXAM:
ESOPHOGRAM/BARIUM SWALLOW
TECHNIQUE: Single contrast examination was performed using  thin barium.
FLUOROSCOPY TIME:  Fluoroscopy Time:  0 minutes 24 seconds
Radiation Exposure Index (if provided by the fluoroscopic device):
6.1 mGy

[Series 1: fluoro_barium 2fps_bw · 0.18mm/px · 3 of 4 frames shown (1 of 3)]
[frame 1/4]
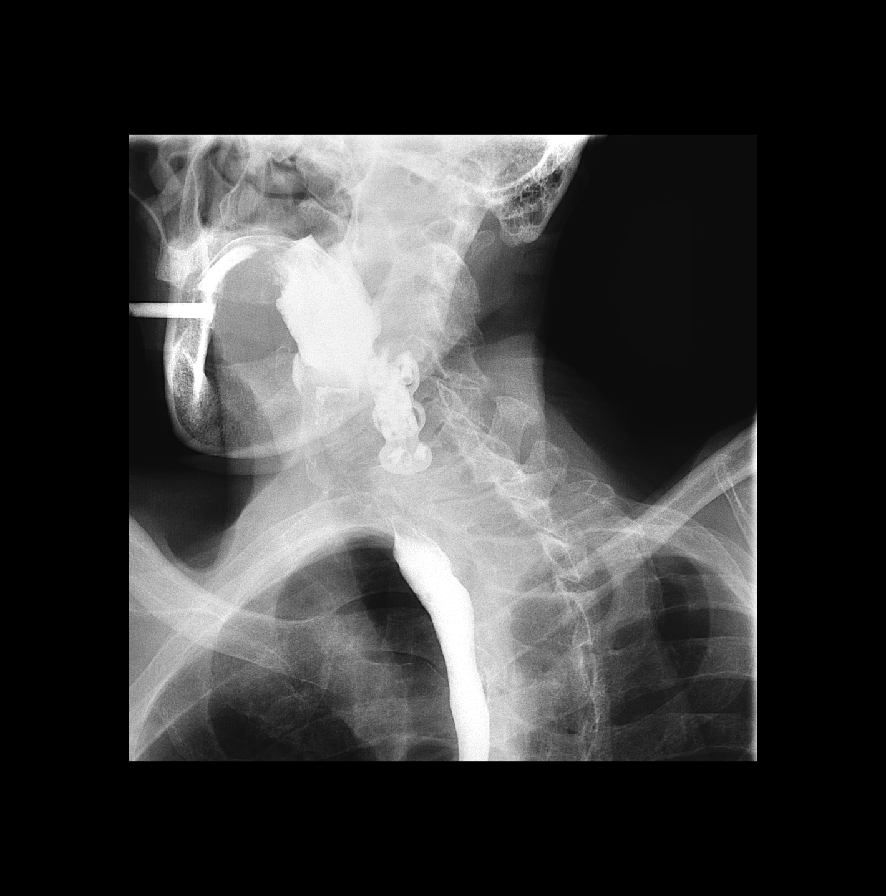
[frame 3/4]
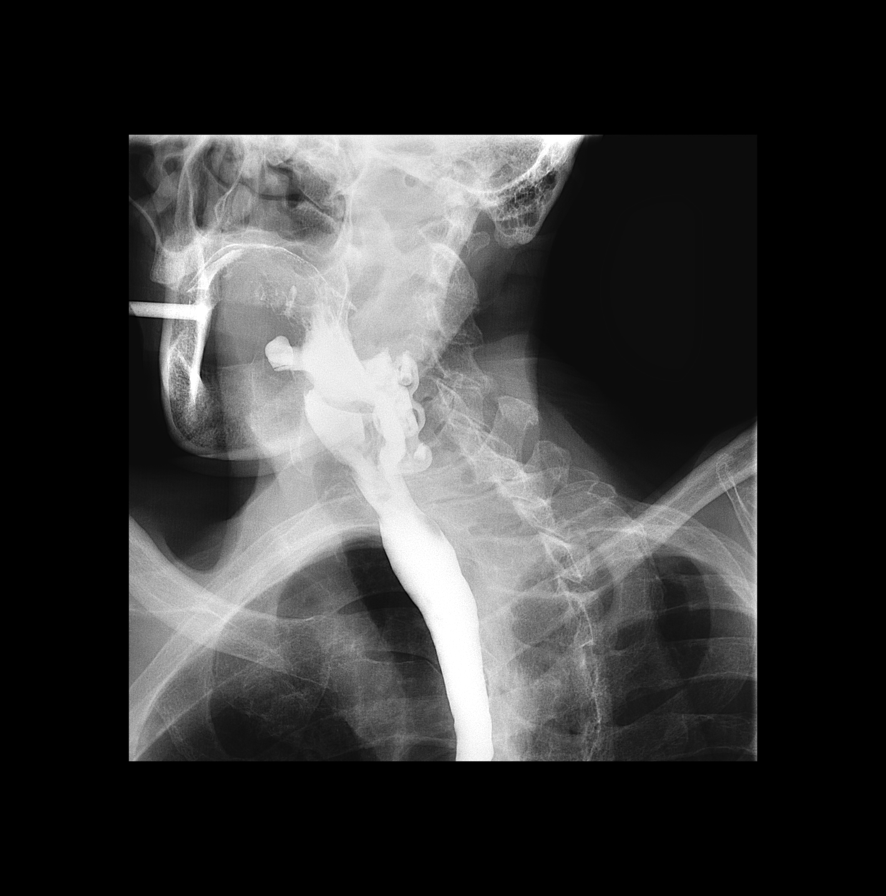
[frame 4/4]
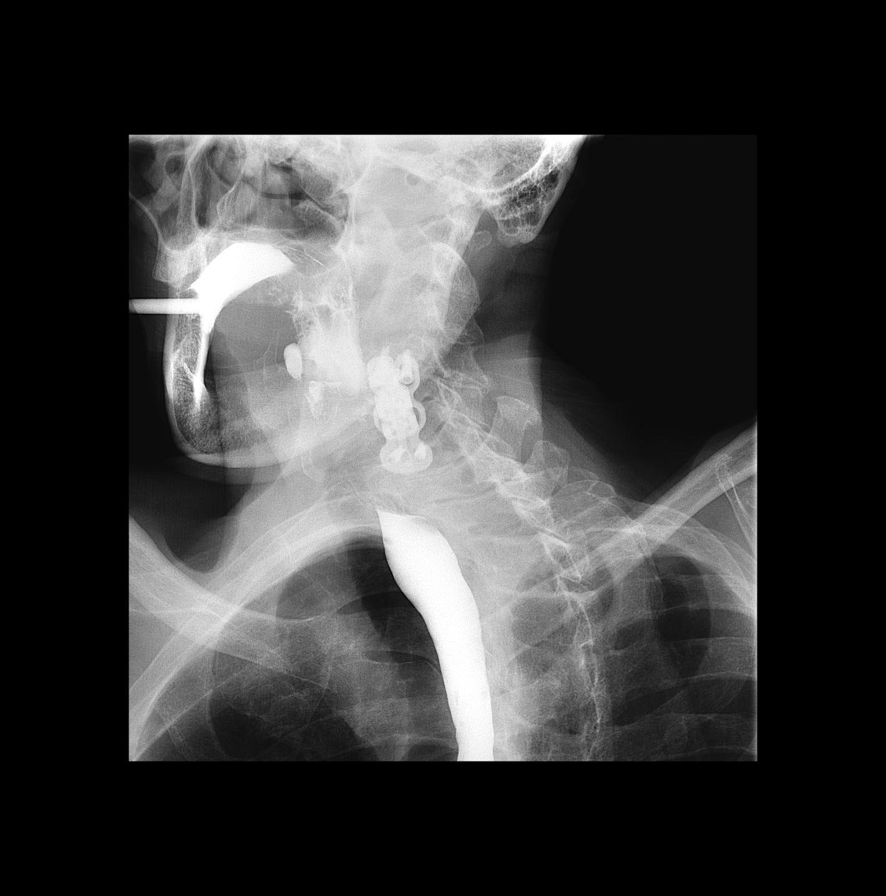

[Series 2: fluoro_barium 2fps_bw · 0.18mm/px · 1 of 1 slices shown (2 of 3)]
[im 1/1]
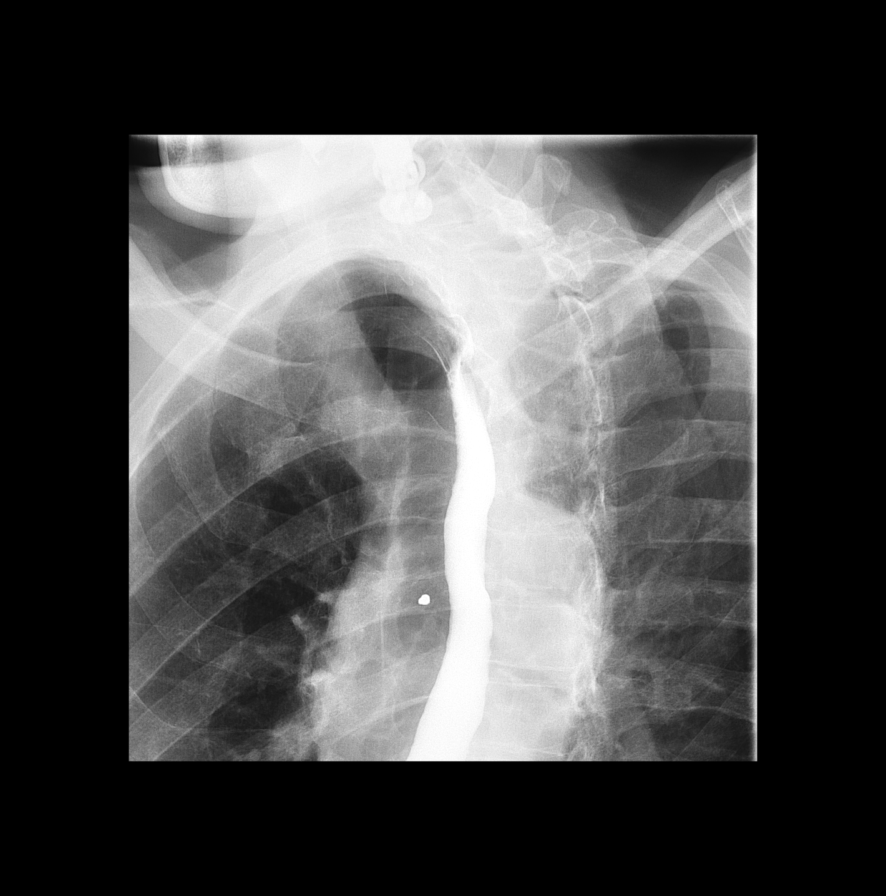

[Series 3: fluoro_barium 2fps_bw · 0.18mm/px · 3 of 7 frames shown (3 of 3)]
[frame 2/7]
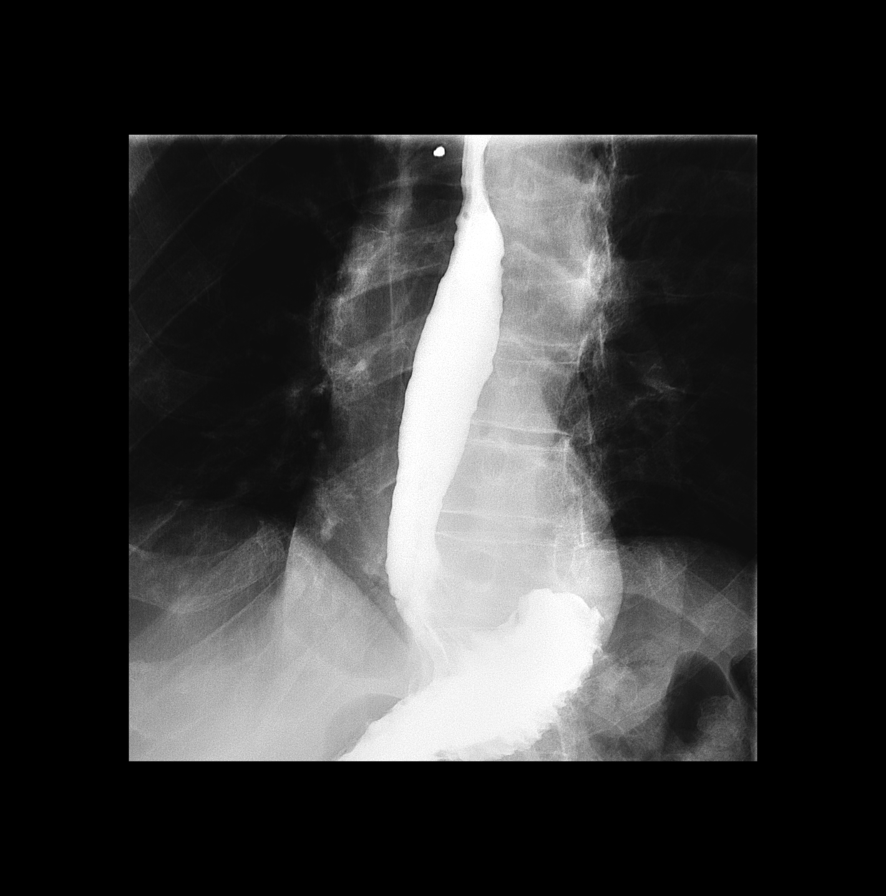
[frame 4/7]
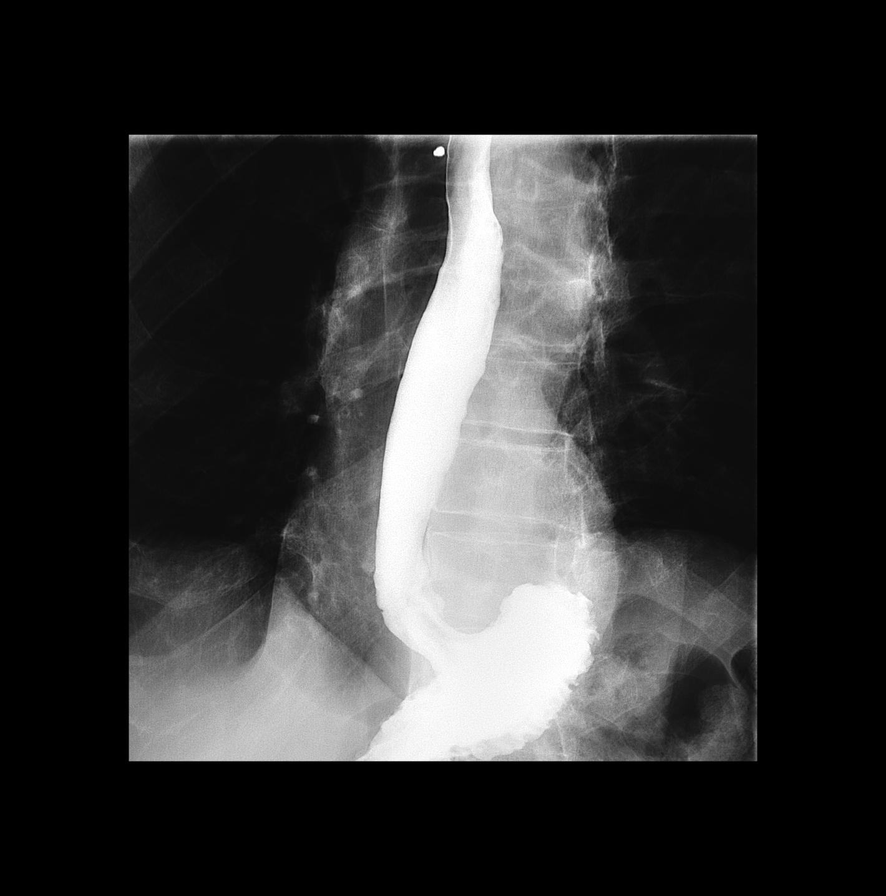
[frame 6/7]
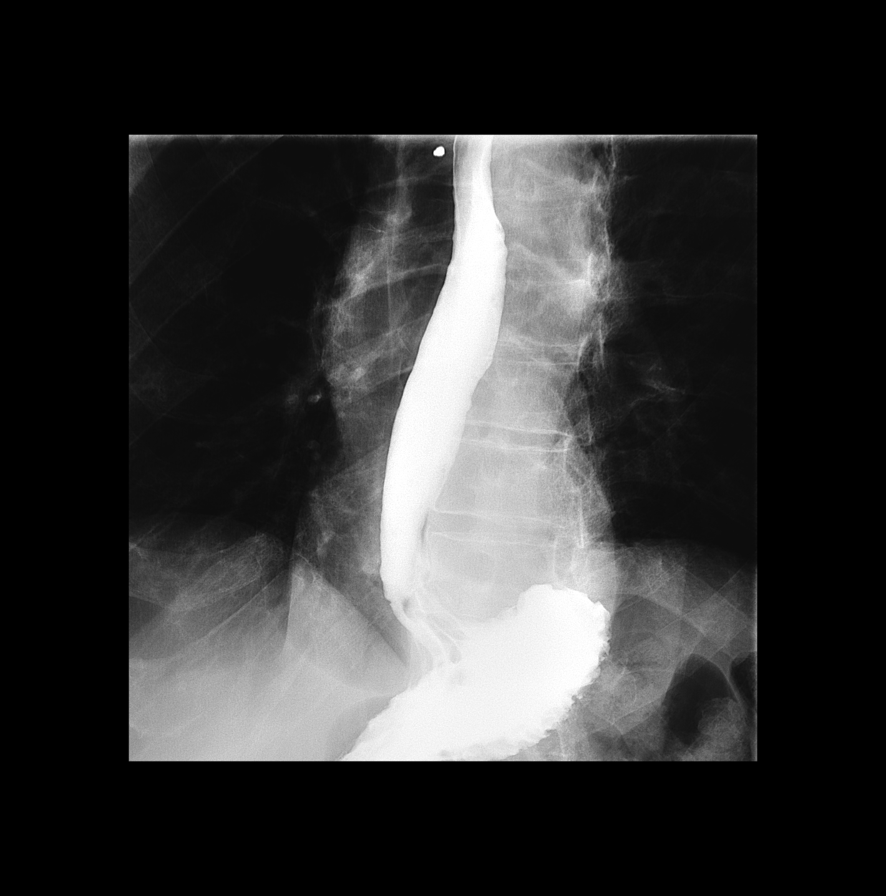

[7 of 7 positions shown; findings below may reference images not displayed]

FINDINGS: Cervical esophagus is widely patent. No evidence of aspiration.
Thoracic esophagus is widely patent. No evidence of obstruction. No
reflux noted. Tiny hiatal hernia noted. Prior cervical spine fusion.
IMPRESSION: 1.  Cervical esophagus is widely patent.

2. Thoracic esophagus is widely patent. No evidence of esophageal
obstruction. No reflux noted. Tiny hiatal hernia noted.
